# Patient Record
Sex: Male | Born: 1995 | Race: Black or African American | Marital: Single | Smoking: Never smoker
Health system: Southern US, Community
[De-identification: ages and names within clinical notes are randomized; demographics above are authoritative.]

## PROBLEM LIST (undated history)

## (undated) DIAGNOSIS — I10 Essential (primary) hypertension: Secondary | ICD-10-CM

---

## 2015-06-05 DIAGNOSIS — J189 Pneumonia, unspecified organism: Secondary | ICD-10-CM

## 2015-06-05 DIAGNOSIS — W3400XA Accidental discharge from unspecified firearms or gun, initial encounter: Secondary | ICD-10-CM

## 2015-06-05 HISTORY — PX: CHOLECYSTECTOMY: SHX55

## 2015-06-05 HISTORY — PX: COLOSTOMY: SHX63

## 2015-06-05 HISTORY — DX: Accidental discharge from unspecified firearms or gun, initial encounter: W34.00XA

## 2015-06-05 HISTORY — DX: Pneumonia, unspecified organism: J18.9

## 2020-09-09 ENCOUNTER — Other Ambulatory Visit: Payer: Self-pay | Admitting: Surgery

## 2020-09-09 DIAGNOSIS — Z933 Colostomy status: Secondary | ICD-10-CM

## 2020-09-27 ENCOUNTER — Ambulatory Visit
Admission: RE | Admit: 2020-09-27 | Discharge: 2020-09-27 | Disposition: A | Discharge status: Home / Self Care | Payer: PRIVATE HEALTH INSURANCE | Source: Ambulatory Visit | Attending: Surgery | Admitting: Surgery

## 2020-09-27 ENCOUNTER — Other Ambulatory Visit: Payer: Self-pay

## 2020-09-27 DIAGNOSIS — Z933 Colostomy status: Secondary | ICD-10-CM

## 2020-09-27 MED ORDER — IOPAMIDOL (ISOVUE-300) INJECTION 61%
100.0000 mL | Freq: Once | INTRAVENOUS | Status: AC | PRN
  Administered 2020-09-27: 100 mL via INTRAVENOUS

## 2020-11-21 NOTE — Progress Notes (Signed)
Surgical Instructions    Your procedure is scheduled on Friday June 24th.  Report to [facility] Main Entrance "A" at 1200 P.M., then check in with the Admitting office.  Call this number if you have problems the morning of surgery:  [phone]   If you have any questions prior to your surgery date call [phone]: Open Monday-Friday 8am-4pm    Remember:  Do not eat or drink anything after midnight the night before your surgery     Take these medicines the morning of surgery with A SIP OF WATER  NONE    As of today, STOP taking any Aspirin (unless otherwise instructed by your surgeon) Aleve, Naproxen, Ibuprofen, Motrin, Advil, Goody's, BC's, all herbal medications, fish oil, and all vitamins.          Do not wear jewelry  Do not wear lotions, powders, colognes, or deodorant. Do not shave 48 hours prior to surgery.  Men may shave face and neck. Do not bring valuables to the hospital. DO Not wear nail polish, gel polish, artificial nails, or any other type of covering on natural nails including finger and toenails. If patients have artificial nails, gel coating, etc. that need to be removed by a nail salon please have this removed prior to surgery or surgery may need to be canceled/delayed if the surgeon/ anesthesia feels like the patient is unable to be adequately monitored.             [facility] is not responsible for any belongings or valuables.  Do NOT Smoke (Tobacco/Vaping) or drink Alcohol 24 hours prior to your procedure If you use a CPAP at night, you may bring all equipment for your overnight stay.   Contacts, glasses, dentures or bridgework may not be worn into surgery, please bring cases for these belongings   For patients admitted to the hospital, discharge time will be determined by your treatment team.   Patients discharged the day of surgery will not be allowed to drive home, and someone needs to stay with them for 24 hours.  ONLY 1 SUPPORT PERSON MAY BE  PRESENT WHILE YOU ARE IN SURGERY. IF YOU ARE TO BE ADMITTED ONCE YOU ARE IN YOUR ROOM YOU WILL BE ALLOWED TWO (2) VISITORS.  Minor children may have two parents present. Special consideration for safety and communication needs will be reviewed on a case by case basis.  Special instructions:    Oral Hygiene is also important to reduce your risk of infection.  Remember - BRUSH YOUR TEETH THE MORNING OF SURGERY WITH YOUR REGULAR TOOTHPASTE   [facility]- Preparing For Surgery  Before surgery, you can play an important role. Because skin is not sterile, your skin needs to be as free of germs as possible. You can reduce the number of germs on your skin by washing with CHG (chlorahexidine gluconate) Soap before surgery.  CHG is an antiseptic cleaner which kills germs and bonds with the skin to continue killing germs even after washing.     Please do not use if you have an allergy to CHG or antibacterial soaps. If your skin becomes reddened/irritated stop using the CHG.  Do not shave (including legs and underarms) for at least 48 hours prior to first CHG shower. It is OK to shave your face.  Please follow these instructions carefully.     Shower the NIGHT BEFORE SURGERY and the MORNING OF SURGERY with CHG Soap.   If you chose to wash your hair, wash your hair  first as usual with your normal shampoo. After you shampoo, rinse your hair and body thoroughly to remove the shampoo.  Then [employer] and genitals (private parts) with your normal soap and rinse thoroughly to remove soap.  After that Use CHG Soap as you would any other liquid soap. You can apply CHG directly to the skin and wash gently with a scrungie or a clean washcloth.   Apply the CHG Soap to your body ONLY FROM THE NECK DOWN.  Do not use on open wounds or open sores. Avoid contact with your eyes, ears, mouth and genitals (private parts). Wash Face and genitals (private parts)  with your normal soap.   Wash thoroughly, paying special  attention to the area where your surgery will be performed.  Thoroughly rinse your body with warm water from the neck down.  DO NOT shower/wash with your normal soap after using and rinsing off the CHG Soap.  Pat yourself dry with a CLEAN TOWEL.  Wear CLEAN PAJAMAS to bed the night before surgery  Place CLEAN SHEETS on your bed the night before your surgery  DO NOT SLEEP WITH PETS.   Day of Surgery:  Take a shower with CHG soap. Wear Clean/Comfortable clothing the morning of surgery Do not apply any deodorants/lotions.   Remember to brush your teeth WITH YOUR REGULAR TOOTHPASTE.   Please read over the following fact sheets that you were given.

## 2020-11-22 ENCOUNTER — Encounter (HOSPITAL_COMMUNITY)
Admission: RE | Admit: 2020-11-22 | Discharge: 2020-11-22 | Disposition: A | Discharge status: Home / Self Care | Payer: No Typology Code available for payment source | Source: Ambulatory Visit | Attending: Surgery | Admitting: Surgery

## 2020-11-22 ENCOUNTER — Other Ambulatory Visit (HOSPITAL_COMMUNITY)
Admission: RE | Admit: 2020-11-22 | Discharge: 2020-11-22 | Disposition: A | Discharge status: Home / Self Care | Payer: No Typology Code available for payment source | Source: Ambulatory Visit | Attending: Surgery | Admitting: Surgery

## 2020-11-22 ENCOUNTER — Encounter (HOSPITAL_COMMUNITY): Payer: Self-pay

## 2020-11-22 ENCOUNTER — Ambulatory Visit: Payer: Self-pay | Admitting: Surgery

## 2020-11-22 ENCOUNTER — Other Ambulatory Visit: Payer: Self-pay

## 2020-11-22 DIAGNOSIS — Z20822 Contact with and (suspected) exposure to covid-19: Secondary | ICD-10-CM | POA: Insufficient documentation

## 2020-11-22 DIAGNOSIS — Z01818 Encounter for other preprocedural examination: Secondary | ICD-10-CM | POA: Insufficient documentation

## 2020-11-22 DIAGNOSIS — Z01812 Encounter for preprocedural laboratory examination: Secondary | ICD-10-CM | POA: Insufficient documentation

## 2020-11-22 HISTORY — DX: Essential (primary) hypertension: I10

## 2020-11-22 LAB — COMPREHENSIVE METABOLIC PANEL
ALT: 28 U/L (ref 0–44)
AST: 28 U/L (ref 15–41)
Albumin: 3.9 g/dL (ref 3.5–5.0)
Alkaline Phosphatase: 49 U/L (ref 38–126)
Anion gap: 8 (ref 5–15)
BUN: 9 mg/dL (ref 6–20)
CO2: 26 mmol/L (ref 22–32)
Calcium: 9.2 mg/dL (ref 8.9–10.3)
Chloride: 106 mmol/L (ref 98–111)
Creatinine, Ser: 1.06 mg/dL (ref 0.61–1.24)
GFR, Estimated: >60 mL/min (ref >60)
Glucose, Bld: 102 mg/dL — ABNORMAL HIGH (ref 70–99)
Potassium: 4.2 mmol/L (ref 3.5–5.1)
Sodium: 140 mmol/L (ref 135–145)
Total Bilirubin: 0.6 mg/dL (ref 0.3–1.2)
Total Protein: 7.4 g/dL (ref 6.5–8.1)

## 2020-11-22 LAB — HEMOGLOBIN A1C
Hgb A1c MFr Bld: 6.3 % — ABNORMAL HIGH (ref 4.8–5.6)
Mean Plasma Glucose: 134.11 mg/dL

## 2020-11-22 LAB — CBC
HCT: 45.6 % (ref 39.0–52.0)
Hemoglobin: 14.5 g/dL (ref 13.0–17.0)
MCH: 27.1 pg (ref 26.0–34.0)
MCHC: 31.8 g/dL (ref 30.0–36.0)
MCV: 85.1 fL (ref 80.0–100.0)
Platelets: 320 10*3/uL (ref 150–400)
RBC: 5.36 MIL/uL (ref 4.22–5.81)
RDW: 15.6 % — ABNORMAL HIGH (ref 11.5–15.5)
WBC: 10.8 10*3/uL — ABNORMAL HIGH (ref 4.0–10.5)
nRBC: 0 % (ref 0.0–0.2)

## 2020-11-22 LAB — SARS CORONAVIRUS 2 (TAT 6-24 HRS): SARS Coronavirus 2: NEGATIVE

## 2020-11-22 NOTE — Progress Notes (Signed)
Patient had received special instructions from [facility] office regarding diet and medications to take starting 5 days prior to surgery.  Using bowel prep which he has already obtained.  Switching day before surgery to clear liquid diet.
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
## 2020-11-25 ENCOUNTER — Encounter (HOSPITAL_COMMUNITY): Admission: RE | Disposition: A | Discharge status: Home / Self Care | Payer: Self-pay | Source: Home / Self Care

## 2020-11-25 ENCOUNTER — Inpatient Hospital Stay (HOSPITAL_COMMUNITY): Payer: No Typology Code available for payment source | Admitting: Physician Assistant

## 2020-11-25 ENCOUNTER — Inpatient Hospital Stay (HOSPITAL_COMMUNITY)
Admission: RE | Admit: 2020-11-25 | Discharge: 2020-12-15 | DRG: 329 | Disposition: A | Discharge status: Home Health | Payer: No Typology Code available for payment source | Attending: Surgery | Admitting: Surgery

## 2020-11-25 ENCOUNTER — Other Ambulatory Visit: Payer: Self-pay

## 2020-11-25 ENCOUNTER — Encounter (HOSPITAL_COMMUNITY): Payer: Self-pay | Admitting: Surgery

## 2020-11-25 ENCOUNTER — Inpatient Hospital Stay (HOSPITAL_COMMUNITY): Payer: No Typology Code available for payment source | Admitting: Certified Registered Nurse Anesthetist

## 2020-11-25 DIAGNOSIS — N200 Calculus of kidney: Secondary | ICD-10-CM | POA: Diagnosis present

## 2020-11-25 DIAGNOSIS — K760 Fatty (change of) liver, not elsewhere classified: Secondary | ICD-10-CM | POA: Diagnosis present

## 2020-11-25 DIAGNOSIS — Z20822 Contact with and (suspected) exposure to covid-19: Secondary | ICD-10-CM | POA: Diagnosis present

## 2020-11-25 DIAGNOSIS — Z978 Presence of other specified devices: Secondary | ICD-10-CM

## 2020-11-25 DIAGNOSIS — A419 Sepsis, unspecified organism: Secondary | ICD-10-CM | POA: Diagnosis not present

## 2020-11-25 DIAGNOSIS — K567 Ileus, unspecified: Secondary | ICD-10-CM | POA: Diagnosis not present

## 2020-11-25 DIAGNOSIS — F419 Anxiety disorder, unspecified: Secondary | ICD-10-CM | POA: Diagnosis present

## 2020-11-25 DIAGNOSIS — Z4659 Encounter for fitting and adjustment of other gastrointestinal appliance and device: Secondary | ICD-10-CM

## 2020-11-25 DIAGNOSIS — Y832 Surgical operation with anastomosis, bypass or graft as the cause of abnormal reaction of the patient, or of later complication, without mention of misadventure at the time of the procedure: Secondary | ICD-10-CM | POA: Diagnosis present

## 2020-11-25 DIAGNOSIS — W3400XA Accidental discharge from unspecified firearms or gun, initial encounter: Secondary | ICD-10-CM | POA: Diagnosis not present

## 2020-11-25 DIAGNOSIS — Z9889 Other specified postprocedural states: Secondary | ICD-10-CM | POA: Diagnosis present

## 2020-11-25 DIAGNOSIS — R188 Other ascites: Secondary | ICD-10-CM | POA: Diagnosis not present

## 2020-11-25 DIAGNOSIS — R160 Hepatomegaly, not elsewhere classified: Secondary | ICD-10-CM | POA: Diagnosis present

## 2020-11-25 DIAGNOSIS — R000 Tachycardia, unspecified: Secondary | ICD-10-CM | POA: Diagnosis not present

## 2020-11-25 DIAGNOSIS — J9690 Respiratory failure, unspecified, unspecified whether with hypoxia or hypercapnia: Secondary | ICD-10-CM

## 2020-11-25 DIAGNOSIS — K9189 Other postprocedural complications and disorders of digestive system: Secondary | ICD-10-CM | POA: Diagnosis present

## 2020-11-25 DIAGNOSIS — Z433 Encounter for attention to colostomy: Secondary | ICD-10-CM | POA: Diagnosis not present

## 2020-11-25 DIAGNOSIS — Z452 Encounter for adjustment and management of vascular access device: Secondary | ICD-10-CM

## 2020-11-25 DIAGNOSIS — I10 Essential (primary) hypertension: Secondary | ICD-10-CM | POA: Diagnosis present

## 2020-11-25 DIAGNOSIS — R0682 Tachypnea, not elsewhere classified: Secondary | ICD-10-CM | POA: Diagnosis not present

## 2020-11-25 DIAGNOSIS — N179 Acute kidney failure, unspecified: Secondary | ICD-10-CM | POA: Diagnosis not present

## 2020-11-25 DIAGNOSIS — Z9049 Acquired absence of other specified parts of digestive tract: Secondary | ICD-10-CM

## 2020-11-25 DIAGNOSIS — K435 Parastomal hernia without obstruction or  gangrene: Secondary | ICD-10-CM | POA: Diagnosis present

## 2020-11-25 DIAGNOSIS — Z79899 Other long term (current) drug therapy: Secondary | ICD-10-CM

## 2020-11-25 DIAGNOSIS — Z0189 Encounter for other specified special examinations: Secondary | ICD-10-CM

## 2020-11-25 DIAGNOSIS — L0291 Cutaneous abscess, unspecified: Secondary | ICD-10-CM

## 2020-11-25 HISTORY — PX: COLOSTOMY REVERSAL: SHX5782

## 2020-11-25 HISTORY — PX: PARASTOMAL HERNIA REPAIR: SHX2162

## 2020-11-25 HISTORY — PX: PROCTOSCOPY: SHX2266

## 2020-11-25 SURGERY — COLOSTOMY REVERSAL
Anesthesia: General | Site: Abdomen

## 2020-11-25 MED ORDER — FENTANYL CITRATE (PF) 250 MCG/5ML IJ SOLN
INTRAMUSCULAR | Status: DC | PRN
  Administered 2020-11-25 (×2): 100 ug via INTRAVENOUS
  Administered 2020-11-25 (×2): 50 ug via INTRAVENOUS
  Administered 2020-11-25: 100 ug via INTRAVENOUS
  Administered 2020-11-25 (×4): 50 ug via INTRAVENOUS
  Administered 2020-11-25: 150 ug via INTRAVENOUS
  Administered 2020-11-25: 50 ug via INTRAVENOUS

## 2020-11-25 MED ORDER — SUGAMMADEX SODIUM 200 MG/2ML IV SOLN
INTRAVENOUS | Status: DC | PRN
  Administered 2020-11-25: 300 mg via INTRAVENOUS

## 2020-11-25 MED ORDER — FENTANYL CITRATE (PF) 250 MCG/5ML IJ SOLN
INTRAMUSCULAR | Status: AC
  Filled 2020-11-25: qty 5

## 2020-11-25 MED ORDER — OXYCODONE HCL 5 MG/5ML PO SOLN: 5.0000 mg | Freq: Once | ORAL | Status: DC | PRN

## 2020-11-25 MED ORDER — HYDRALAZINE HCL 20 MG/ML IJ SOLN
INTRAMUSCULAR | Status: AC
  Filled 2020-11-25: qty 1

## 2020-11-25 MED ORDER — MEPERIDINE HCL 25 MG/ML IJ SOLN: 6.2500 mg | INTRAMUSCULAR | Status: DC | PRN

## 2020-11-25 MED ORDER — MIDAZOLAM HCL 2 MG/2ML IJ SOLN
INTRAMUSCULAR | Status: AC
  Filled 2020-11-25: qty 2

## 2020-11-25 MED ORDER — LACTATED RINGERS IV SOLN
INTRAVENOUS | Status: DC
  Administered 2020-11-27: 1000 mL via INTRAVENOUS

## 2020-11-25 MED ORDER — PROPOFOL 10 MG/ML IV BOLUS
INTRAVENOUS | Status: DC | PRN
  Administered 2020-11-25: 50 mg via INTRAVENOUS
  Administered 2020-11-25: 200 mg via INTRAVENOUS

## 2020-11-25 MED ORDER — HYDROMORPHONE HCL 1 MG/ML IJ SOLN
0.2500 mg | INTRAMUSCULAR | Status: DC | PRN
  Administered 2020-11-25 (×3): 0.5 mg via INTRAVENOUS

## 2020-11-25 MED ORDER — ACETAMINOPHEN 500 MG PO TABS
1000.0000 mg | ORAL_TABLET | Freq: Four times a day (QID) | ORAL | Status: DC
  Administered 2020-11-26 – 2020-11-30 (×13): 1000 mg via ORAL
  Filled 2020-11-25 (×15): qty 2

## 2020-11-25 MED ORDER — ACETAMINOPHEN 10 MG/ML IV SOLN
INTRAVENOUS | Status: AC
  Filled 2020-11-25: qty 100

## 2020-11-25 MED ORDER — CHLORHEXIDINE GLUCONATE CLOTH 2 % EX PADS: 6.0000 | MEDICATED_PAD | Freq: Once | CUTANEOUS | Status: DC

## 2020-11-25 MED ORDER — ACETAMINOPHEN 500 MG PO TABS
1000.0000 mg | ORAL_TABLET | ORAL | Status: AC
  Administered 2020-11-25: 1000 mg via ORAL
  Filled 2020-11-25: qty 2

## 2020-11-25 MED ORDER — PROMETHAZINE HCL 25 MG/ML IJ SOLN: 6.2500 mg | INTRAMUSCULAR | Status: DC | PRN

## 2020-11-25 MED ORDER — PHENYLEPHRINE 40 MCG/ML (10ML) SYRINGE FOR IV PUSH (FOR BLOOD PRESSURE SUPPORT)
PREFILLED_SYRINGE | INTRAVENOUS | Status: DC | PRN
  Administered 2020-11-25: 200 ug via INTRAVENOUS
  Administered 2020-11-25: 160 ug via INTRAVENOUS
  Administered 2020-11-25: 200 ug via INTRAVENOUS

## 2020-11-25 MED ORDER — PROPOFOL 10 MG/ML IV BOLUS
INTRAVENOUS | Status: AC
  Filled 2020-11-25: qty 20

## 2020-11-25 MED ORDER — HYDROMORPHONE HCL 1 MG/ML IJ SOLN
INTRAMUSCULAR | Status: AC
  Filled 2020-11-25: qty 1

## 2020-11-25 MED ORDER — AMISULPRIDE (ANTIEMETIC) 5 MG/2ML IV SOLN: 10.0000 mg | Freq: Once | INTRAVENOUS | Status: DC | PRN

## 2020-11-25 MED ORDER — MIDAZOLAM HCL 2 MG/2ML IJ SOLN
INTRAMUSCULAR | Status: DC | PRN
  Administered 2020-11-25: 2 mg via INTRAVENOUS

## 2020-11-25 MED ORDER — ENOXAPARIN SODIUM 30 MG/0.3ML IJ SOSY: 30.0000 mg | PREFILLED_SYRINGE | Freq: Two times a day (BID) | INTRAMUSCULAR | Status: DC

## 2020-11-25 MED ORDER — DEXMEDETOMIDINE (PRECEDEX) IN NS 20 MCG/5ML (4 MCG/ML) IV SYRINGE
PREFILLED_SYRINGE | INTRAVENOUS | Status: DC | PRN
Start: 2020-11-25 — End: 2020-11-25
  Administered 2020-11-25 (×2): 10 ug via INTRAVENOUS

## 2020-11-25 MED ORDER — ACETAMINOPHEN 10 MG/ML IV SOLN
1000.0000 mg | Freq: Once | INTRAVENOUS | Status: AC
  Administered 2020-11-25: 1000 mg via INTRAVENOUS

## 2020-11-25 MED ORDER — LABETALOL HCL 5 MG/ML IV SOLN
INTRAVENOUS | Status: AC
  Filled 2020-11-25: qty 4

## 2020-11-25 MED ORDER — ORAL CARE MOUTH RINSE: 15.0000 mL | Freq: Once | OROMUCOSAL | Status: AC

## 2020-11-25 MED ORDER — ALBUMIN HUMAN 5 % IV SOLN: INTRAVENOUS | Status: DC | PRN

## 2020-11-25 MED ORDER — DEXAMETHASONE SODIUM PHOSPHATE 10 MG/ML IJ SOLN
INTRAMUSCULAR | Status: AC
  Filled 2020-11-25: qty 1

## 2020-11-25 MED ORDER — ROCURONIUM BROMIDE 10 MG/ML (PF) SYRINGE
PREFILLED_SYRINGE | INTRAVENOUS | Status: AC
  Filled 2020-11-25: qty 10

## 2020-11-25 MED ORDER — HYDRALAZINE HCL 20 MG/ML IJ SOLN
10.0000 mg | Freq: Once | INTRAMUSCULAR | Status: AC
  Administered 2020-11-25: 10 mg via INTRAVENOUS

## 2020-11-25 MED ORDER — LABETALOL HCL 5 MG/ML IV SOLN
10.0000 mg | Freq: Once | INTRAVENOUS | Status: AC
  Administered 2020-11-25: 10 mg via INTRAVENOUS
  Filled 2020-11-25: qty 4

## 2020-11-25 MED ORDER — OXYCODONE HCL 5 MG PO TABS: 5.0000 mg | ORAL_TABLET | Freq: Once | ORAL | Status: DC | PRN

## 2020-11-25 MED ORDER — PHENYLEPHRINE HCL-NACL 10-0.9 MG/250ML-% IV SOLN
INTRAVENOUS | Status: DC | PRN
  Administered 2020-11-25: 50 ug/min via INTRAVENOUS

## 2020-11-25 MED ORDER — DEXAMETHASONE SODIUM PHOSPHATE 10 MG/ML IJ SOLN
INTRAMUSCULAR | Status: DC | PRN
  Administered 2020-11-25: 10 mg via INTRAVENOUS

## 2020-11-25 MED ORDER — ONDANSETRON 4 MG PO TBDP: 4.0000 mg | ORAL_TABLET | Freq: Four times a day (QID) | ORAL | Status: DC | PRN

## 2020-11-25 MED ORDER — ONDANSETRON HCL 4 MG/2ML IJ SOLN
INTRAMUSCULAR | Status: AC
  Filled 2020-11-25: qty 2

## 2020-11-25 MED ORDER — PROPOFOL 10 MG/ML IV BOLUS
INTRAVENOUS | Status: AC
  Filled 2020-11-25: qty 40

## 2020-11-25 MED ORDER — HYDROMORPHONE HCL 1 MG/ML IJ SOLN
INTRAMUSCULAR | Status: DC | PRN
  Administered 2020-11-25: .5 mg via INTRAVENOUS

## 2020-11-25 MED ORDER — OXYCODONE HCL 5 MG/5ML PO SOLN
5.0000 mg | ORAL | Status: DC | PRN
  Administered 2020-11-26 – 2020-11-29 (×15): 10 mg via ORAL
  Filled 2020-11-25 (×16): qty 10

## 2020-11-25 MED ORDER — ROCURONIUM BROMIDE 10 MG/ML (PF) SYRINGE
PREFILLED_SYRINGE | INTRAVENOUS | Status: DC | PRN
  Administered 2020-11-25 (×3): 100 mg via INTRAVENOUS

## 2020-11-25 MED ORDER — HYDROMORPHONE HCL 1 MG/ML IJ SOLN
INTRAMUSCULAR | Status: AC
  Filled 2020-11-25: qty 0.5

## 2020-11-25 MED ORDER — 0.9 % SODIUM CHLORIDE (POUR BTL) OPTIME
TOPICAL | Status: DC | PRN
  Administered 2020-11-25 (×2): 2000 mL
  Administered 2020-11-25: 1000 mL

## 2020-11-25 MED ORDER — LABETALOL HCL 5 MG/ML IV SOLN
10.0000 mg | Freq: Once | INTRAVENOUS | Status: AC
  Administered 2020-11-25: 10 mg via INTRAVENOUS

## 2020-11-25 MED ORDER — LIDOCAINE 2% (20 MG/ML) 5 ML SYRINGE
INTRAMUSCULAR | Status: DC | PRN
  Administered 2020-11-25: 100 mg via INTRAVENOUS

## 2020-11-25 MED ORDER — MORPHINE SULFATE (PF) 2 MG/ML IV SOLN
2.0000 mg | INTRAVENOUS | Status: DC | PRN
Start: 2020-11-25 — End: 2020-11-29
  Administered 2020-11-26 – 2020-11-28 (×8): 4 mg via INTRAVENOUS
  Filled 2020-11-25 (×8): qty 2

## 2020-11-25 MED ORDER — ONDANSETRON HCL 4 MG/2ML IJ SOLN: 4.0000 mg | Freq: Four times a day (QID) | INTRAMUSCULAR | Status: DC | PRN

## 2020-11-25 MED ORDER — SUGAMMADEX SODIUM 500 MG/5ML IV SOLN
INTRAVENOUS | Status: AC
  Filled 2020-11-25: qty 5

## 2020-11-25 MED ORDER — ONDANSETRON HCL 4 MG/2ML IJ SOLN
INTRAMUSCULAR | Status: DC | PRN
  Administered 2020-11-25: 4 mg via INTRAVENOUS

## 2020-11-25 MED ORDER — KETOROLAC TROMETHAMINE 15 MG/ML IJ SOLN
30.0000 mg | Freq: Four times a day (QID) | INTRAMUSCULAR | Status: DC
  Administered 2020-11-25 – 2020-11-30 (×18): 30 mg via INTRAVENOUS
  Filled 2020-11-25 (×18): qty 2

## 2020-11-25 MED ORDER — METHOCARBAMOL 1000 MG/10ML IJ SOLN
1000.0000 mg | Freq: Three times a day (TID) | INTRAVENOUS | Status: DC
  Administered 2020-11-25 – 2020-11-28 (×8): 1000 mg via INTRAVENOUS
  Filled 2020-11-25 (×2): qty 10
  Filled 2020-11-25: qty 1000
  Filled 2020-11-25: qty 10
  Filled 2020-11-25: qty 1000
  Filled 2020-11-25 (×6): qty 10

## 2020-11-25 MED ORDER — CEFAZOLIN IN SODIUM CHLORIDE 3-0.9 GM/100ML-% IV SOLN
3.0000 g | INTRAVENOUS | Status: AC
  Administered 2020-11-25: 3 g via INTRAVENOUS
  Filled 2020-11-25: qty 100

## 2020-11-25 MED ORDER — CHLORHEXIDINE GLUCONATE 0.12 % MT SOLN
15.0000 mL | Freq: Once | OROMUCOSAL | Status: AC
  Administered 2020-11-25: 15 mL via OROMUCOSAL
  Filled 2020-11-25: qty 15

## 2020-11-25 MED ORDER — DOCUSATE SODIUM 100 MG PO CAPS
100.0000 mg | ORAL_CAPSULE | Freq: Two times a day (BID) | ORAL | Status: DC
  Administered 2020-11-25 – 2020-11-29 (×8): 100 mg via ORAL
  Filled 2020-11-25 (×8): qty 1

## 2020-11-25 MED ORDER — PHENYLEPHRINE 40 MCG/ML (10ML) SYRINGE FOR IV PUSH (FOR BLOOD PRESSURE SUPPORT)
PREFILLED_SYRINGE | INTRAVENOUS | Status: AC
  Filled 2020-11-25: qty 10

## 2020-11-25 MED ORDER — LACTATED RINGERS IV SOLN: INTRAVENOUS | Status: DC

## 2020-11-25 SURGICAL SUPPLY — 63 items
BINDER ABDOMINAL 12 ML 46-62 (SOFTGOODS) ×3 IMPLANT
BLADE CLIPPER SURG (BLADE) IMPLANT
CANISTER SUCT 3000ML PPV (MISCELLANEOUS) ×3 IMPLANT
CHLORAPREP W/TINT 26 (MISCELLANEOUS) IMPLANT
COVER SURGICAL LIGHT HANDLE (MISCELLANEOUS) ×6 IMPLANT
COVER WAND RF STERILE (DRAPES) ×3 IMPLANT
DRAPE LAPAROSCOPIC ABDOMINAL (DRAPES) ×3 IMPLANT
DRSG OPSITE POSTOP 4X10 (GAUZE/BANDAGES/DRESSINGS) ×3 IMPLANT
DRSG OPSITE POSTOP 4X8 (GAUZE/BANDAGES/DRESSINGS) IMPLANT
DRSG PAD ABDOMINAL 8X10 ST (GAUZE/BANDAGES/DRESSINGS) ×9 IMPLANT
ELECT CAUTERY BLADE 6.4 (BLADE) ×3 IMPLANT
ELECT REM PT RETURN 9FT ADLT (ELECTROSURGICAL) ×3
ELECTRODE REM PT RTRN 9FT ADLT (ELECTROSURGICAL) ×2 IMPLANT
GLOVE SRG 8 PF TXTR STRL LF DI (GLOVE) ×4 IMPLANT
GLOVE SURG ENC MOIS LTX SZ6.5 (GLOVE) ×3 IMPLANT
GLOVE SURG ENC MOIS LTX SZ8 (GLOVE) ×6 IMPLANT
GLOVE SURG UNDER POLY LF SZ6 (GLOVE) ×3 IMPLANT
GLOVE SURG UNDER POLY LF SZ8 (GLOVE) ×2
GOWN STRL REUS W/ TWL LRG LVL3 (GOWN DISPOSABLE) ×8 IMPLANT
GOWN STRL REUS W/ TWL XL LVL3 (GOWN DISPOSABLE) ×4 IMPLANT
GOWN STRL REUS W/TWL LRG LVL3 (GOWN DISPOSABLE) ×4
GOWN STRL REUS W/TWL XL LVL3 (GOWN DISPOSABLE) ×2
KIT BASIN OR (CUSTOM PROCEDURE TRAY) ×3 IMPLANT
KIT SIGMOIDOSCOPE (SET/KITS/TRAYS/PACK) ×3 IMPLANT
KIT TURNOVER KIT B (KITS) ×3 IMPLANT
LIGASURE IMPACT 36 18CM CVD LR (INSTRUMENTS) IMPLANT
MARKER SKIN DUAL TIP RULER LAB (MISCELLANEOUS) ×3 IMPLANT
NEEDLE HYPO 25GX1X1/2 BEV (NEEDLE) ×3 IMPLANT
NS IRRIG 1000ML POUR BTL (IV SOLUTION) ×6 IMPLANT
PACK COLON (CUSTOM PROCEDURE TRAY) ×3 IMPLANT
PACK GENERAL/GYN (CUSTOM PROCEDURE TRAY) ×3 IMPLANT
PAD ARMBOARD 7.5X6 YLW CONV (MISCELLANEOUS) ×6 IMPLANT
PENCIL SMOKE EVACUATOR (MISCELLANEOUS) ×3 IMPLANT
SPECIMEN JAR MEDIUM (MISCELLANEOUS) IMPLANT
SPONGE LAP 18X18 RF (DISPOSABLE) ×12 IMPLANT
STAPLER ECHELON POWER CIR 29 (STAPLE) ×3 IMPLANT
STAPLER VISISTAT 35W (STAPLE) ×3 IMPLANT
SURGILUBE 2OZ TUBE FLIPTOP (MISCELLANEOUS) IMPLANT
SUT MNCRL AB 4-0 PS2 18 (SUTURE) ×3 IMPLANT
SUT NOVA 1 T20/GS 25DT (SUTURE) IMPLANT
SUT NOVA NAB DX-16 0-1 5-0 T12 (SUTURE) ×6 IMPLANT
SUT PDS AB 1 CTX 36 (SUTURE) ×6 IMPLANT
SUT PDS AB 1 TP1 54 (SUTURE) IMPLANT
SUT PDS AB 1 TP1 96 (SUTURE) IMPLANT
SUT PDS AB 3-0 SH 27 (SUTURE) ×6 IMPLANT
SUT PROLENE 2 0 CT2 30 (SUTURE) IMPLANT
SUT PROLENE 2 0 KS (SUTURE) IMPLANT
SUT SILK 2 0 SH CR/8 (SUTURE) ×9 IMPLANT
SUT SILK 2 0 TIES 10X30 (SUTURE) ×3 IMPLANT
SUT SILK 2 0SH CR/8 30 (SUTURE) ×3 IMPLANT
SUT SILK 3 0 SH CR/8 (SUTURE) ×3 IMPLANT
SUT SILK 3 0 TIES 10X30 (SUTURE) ×3 IMPLANT
SUT VIC AB 0 CT1 27 (SUTURE) ×1
SUT VIC AB 0 CT1 27XBRD ANBCTR (SUTURE) ×2 IMPLANT
SUT VIC AB 3-0 SH 27 (SUTURE) ×1
SUT VIC AB 3-0 SH 27XBRD (SUTURE) ×2 IMPLANT
SYR CONTROL 10ML LL (SYRINGE) ×3 IMPLANT
TOWEL GREEN STERILE (TOWEL DISPOSABLE) ×3 IMPLANT
TOWEL GREEN STERILE FF (TOWEL DISPOSABLE) ×3 IMPLANT
TRAY FOLEY MTR SLVR 14FR STAT (SET/KITS/TRAYS/PACK) ×3 IMPLANT
TUBE CONNECTING 12X1/4 (SUCTIONS) ×6 IMPLANT
UNDERPAD 30X36 HEAVY ABSORB (UNDERPADS AND DIAPERS) IMPLANT
WATER STERILE IRR 1000ML POUR (IV SOLUTION) ×3 IMPLANT

## 2020-11-25 NOTE — Anesthesia Procedure Notes (Signed)
Procedure Name: Intubation Date/Time: 11/25/2020 1:30 PM Performed by: Rosiland Oz, CRNA Pre-anesthesia Checklist: Patient identified, Emergency Drugs available, Suction available, Patient being monitored and Timeout performed Patient Re-evaluated:Patient Re-evaluated prior to induction Oxygen Delivery Method: Circle system utilized Preoxygenation: Pre-oxygenation with 100% oxygen Induction Type: IV induction Ventilation: Mask ventilation without difficulty and Oral airway inserted - appropriate to patient size Laryngoscope Size: Miller and 3 Grade View: Grade I Tube type: Oral Tube size: 7.5 mm Number of attempts: 1 Airway Equipment and Method: Stylet Placement Confirmation: ETT inserted through vocal cords under direct vision, positive ETCO2 and breath sounds checked- equal and bilateral Secured at: 22 cm Tube secured with: Tape Dental Injury: Teeth and Oropharynx as per pre-operative assessment
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
## 2020-11-25 NOTE — Transfer of Care (Signed)
Immediate Anesthesia Transfer of Care Note  Patient: [facility]  Procedure(s) Performed: COLOSTOMY REVERSAL (Abdomen) HERNIA REPAIR PARASTOMAL (Abdomen) PROCTOSCOPY  Patient Location: PACU  Anesthesia Type:General  Level of Consciousness: drowsy and patient cooperative  Airway & Oxygen Therapy: Patient Spontanous Breathing and Patient connected to nasal cannula oxygen  Post-op Assessment: Report given to RN, Post -op Vital signs reviewed and stable and Patient moving all extremities  Post vital signs: Reviewed and stable  Last Vitals:  Vitals Value Taken Time  BP 170/108 11/25/20 1825  Temp    Pulse 123 11/25/20 1827  Resp 16 11/25/20 1827  SpO2 97 % 11/25/20 1827  Vitals shown include unvalidated device data.  Last Pain:  Vitals:   11/25/20 1219  TempSrc:   PainSc: 0-No pain         Complications: No notable events documented.
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
## 2020-11-25 NOTE — Anesthesia Postprocedure Evaluation (Signed)
Anesthesia Post Note  Patient: Interior and spatial designer  Procedure(s) Performed: COLOSTOMY REVERSAL (Abdomen) HERNIA REPAIR PARASTOMAL (Abdomen) PROCTOSCOPY     Patient location during evaluation: PACU Anesthesia Type: General Level of consciousness: awake and alert Pain management: pain level controlled Vital Signs Assessment: post-procedure vital signs reviewed and stable Respiratory status: spontaneous breathing, nonlabored ventilation, respiratory function stable and patient connected to nasal cannula oxygen Cardiovascular status: blood pressure returned to baseline and stable Postop Assessment: no apparent nausea or vomiting Anesthetic complications: no   No notable events documented.  Last Vitals:  Vitals:   11/25/20 1925 11/25/20 2200  BP: (!) 153/86   Pulse: 100   Resp: 20 20  Temp:    SpO2: 96%     Last Pain:  Vitals:   11/25/20 1925  TempSrc:   PainSc: Asleep                 Cecile Hearing

## 2020-11-25 NOTE — Progress Notes (Signed)
Received pt from PACU, alert and oriented. Neuro check performed. Patient requesting pain medicine. Abdominal binder applied per MD's order. Will continue monitoring.

## 2020-11-25 NOTE — Progress Notes (Signed)
Notified dr. Bedelia Person and dr Hyacinth Meeker of elevated blood pressure and repeat blood pressure after labetalol.

## 2020-11-25 NOTE — Progress Notes (Signed)
SBP=181/112, MD notified. Hydralazine 10 mg,x1 ordered, SBP rechecked before the administration of hydralazine,-156.116. hydralazine administered, SBP rechecked, and SBP=173/116. Will recheck in 10 minutes.

## 2020-11-26 ENCOUNTER — Encounter (HOSPITAL_COMMUNITY): Payer: Self-pay | Admitting: Surgery

## 2020-11-26 LAB — CBC
HCT: 38.6 % — ABNORMAL LOW (ref 39.0–52.0)
Hemoglobin: 12.7 g/dL — ABNORMAL LOW (ref 13.0–17.0)
MCH: 27.4 pg (ref 26.0–34.0)
MCHC: 32.9 g/dL (ref 30.0–36.0)
MCV: 83.4 fL (ref 80.0–100.0)
Platelets: 313 10*3/uL (ref 150–400)
RBC: 4.63 MIL/uL (ref 4.22–5.81)
RDW: 15.9 % — ABNORMAL HIGH (ref 11.5–15.5)
WBC: 18.8 10*3/uL — ABNORMAL HIGH (ref 4.0–10.5)
nRBC: 0 % (ref 0.0–0.2)

## 2020-11-26 LAB — BASIC METABOLIC PANEL
Anion gap: 11 (ref 5–15)
BUN: 14 mg/dL (ref 6–20)
CO2: 23 mmol/L (ref 22–32)
Calcium: 8.7 mg/dL — ABNORMAL LOW (ref 8.9–10.3)
Chloride: 101 mmol/L (ref 98–111)
Creatinine, Ser: 1.13 mg/dL (ref 0.61–1.24)
GFR, Estimated: >60 mL/min (ref >60)
Glucose, Bld: 145 mg/dL — ABNORMAL HIGH (ref 70–99)
Potassium: 3.8 mmol/L (ref 3.5–5.1)
Sodium: 135 mmol/L (ref 135–145)

## 2020-11-26 LAB — MAGNESIUM: Magnesium: 1.6 mg/dL — ABNORMAL LOW (ref 1.7–2.4)

## 2020-11-26 LAB — HIV ANTIBODY (ROUTINE TESTING W REFLEX): HIV Screen 4th Generation wRfx: NONREACTIVE

## 2020-11-26 LAB — PHOSPHORUS: Phosphorus: 2.5 mg/dL (ref 2.5–4.6)

## 2020-11-26 MED ORDER — ENOXAPARIN SODIUM 40 MG/0.4ML IJ SOSY
40.0000 mg | PREFILLED_SYRINGE | INTRAMUSCULAR | Status: DC
  Filled 2020-11-26: qty 0.4

## 2020-11-26 MED ORDER — POTASSIUM PHOSPHATES 15 MMOLE/5ML IV SOLN
20.0000 mmol | Freq: Once | INTRAVENOUS | Status: AC
  Administered 2020-11-26: 20 mmol via INTRAVENOUS
  Filled 2020-11-26: qty 6.67

## 2020-11-26 MED ORDER — MAGNESIUM SULFATE 2 GM/50ML IV SOLN
2.0000 g | Freq: Once | INTRAVENOUS | Status: AC
  Administered 2020-11-26: 2 g via INTRAVENOUS
  Filled 2020-11-26: qty 50

## 2020-11-26 NOTE — Evaluation (Signed)
Occupational Therapy Evaluation Patient Details Name: Jesse Waters MRN: [id number] DOB: [birth year] Today's Date: 11/26/2020    History of Present Illness This 25 y.o. male admitted 11/22/20 for colostomy reversal and hernia repair.  PMH includes:  HTN, PNA   Clinical Impression   Pt admitted with above. He demonstrates the below listed deficits and will benefit from continued OT to maximize safety and independence with BADLs.  Pt presents to OT with generalized weakness, decreased activity tolerance, increased pain.  He currently requires mod - total A for ADLs and  mod A +2 for bed mobility due to pain.  Unable to progress OOB today due to orthostatic hypotension.   Pt reports he lives at home with family and has good supports.  He reports he was fully independent PTA.  Anticipate good progress as pain is controlled.    BP initially sitting EOB 146/72;  BP EOB  after sitting several minutes 107/42 with pt becoming increasingly sleepy and less responsive,supine;  BP 155/66 after return to supine   Follow Up Recommendations  No OT follow up;Supervision/Assistance - 24 hour (initially)    Equipment Recommendations  3 in 1 bedside commode;Tub/shower seat    Recommendations for Other Services       Precautions / Restrictions Precautions Precautions: Fall Precaution Comments: abdominal binder; orthostatic hypotension Restrictions Weight Bearing Restrictions: No      Mobility Bed Mobility Overal bed mobility: Needs Assistance Bed Mobility: Rolling;Sidelying to Sit;Sit to Supine Rolling: Mod assist;+2 for physical assistance Sidelying to sit: Mod assist;+2 for physical assistance;HOB elevated   Sit to supine: Max assist;+2 for safety/equipment   General bed mobility comments: Pt needing extensive period of time due to high anxiety and pain (> 30 minutes) to log roll and sit up EOB. Pt needing several rest breaks mid-roll to mentally prepare to complete it. Cues provided to push through  legs and pull on therapist hand to roll. Cues to bring legs off EOB and pull up with R hand on therapist hand and push up from bed with L UE to ascend trunk. ModAx2 to complete. MaxAx2 to return to supine as pt displayed symptomatic orthostatic hypotension.    Transfers                 General transfer comment: Deferred due to symptomatic orthostatic hypotension.    Balance Overall balance assessment: Needs assistance Sitting-balance support: No upper extremity supported;Single extremity supported;Feet supported Sitting balance-Leahy Scale: Fair Sitting balance - Comments: Able to sit statically without UE support but prefers to prop himself up on his R elbow on the bed rail at end of bed.       Standing balance comment: Deferred due to symptomatic orthostatic hypotension.                           ADL either performed or assessed with clinical judgement   ADL Overall ADL's : Needs assistance/impaired     Grooming: Wash/dry hands;Wash/dry face;Oral care;Brushing hair;Set up;Bed level   Upper Body Bathing: Moderate assistance;Bed level   Lower Body Bathing: Maximal assistance;Bed level   Upper Body Dressing : Moderate assistance;Bed level;Sitting   Lower Body Dressing: Total assistance;Bed level   Toilet Transfer: Total assistance Toilet Transfer Details (indicate cue type and reason): unable Toileting- Clothing Manipulation and Hygiene: Total assistance;Bed level       Functional mobility during ADLs: Moderate assistance;+2 for physical assistance;+2 for safety/equipment (bed mobility only)  Vision         Perception     Praxis      Pertinent Vitals/Pain Pain Assessment: 0-10 Pain Score: 10-Worst pain ever Pain Location: abdomen Pain Descriptors / Indicators: Discomfort;Guarding;Grimacing;Moaning Pain Intervention(s): Monitored during session;Limited activity within patient's tolerance;Repositioned     Hand Dominance Right    Extremity/Trunk Assessment Upper Extremity Assessment Upper Extremity Assessment: Generalized weakness   Lower Extremity Assessment Lower Extremity Assessment: Defer to PT evaluation   Cervical / Trunk Assessment Cervical / Trunk Assessment: Normal   Communication Communication Communication: No difficulties   Cognition Arousal/Alertness: Awake/alert Behavior During Therapy: Flat affect;Anxious Overall Cognitive Status: Within Functional Limits for tasks assessed                                 General Comments: Pt with high anxiety with movement secondary to pain, needing continual encouragement throughout session. Pt with fairly flat affect and taking increased time to respond to questions, especially as his BP dropped.   General Comments  BP initially sitting EOB 146/72; BP after sitting EOB several minutes 107/42 with pt becoming increasingly sleepy and less responsive, thus pt returned to supine; BP once supine 155/66 with pt becoming more awake and responsive; RN notified    Exercises     Shoulder Instructions      Home Living Family/patient expects to be discharged to:: Private residence Living Arrangements: Parent Available Help at Discharge: Family Type of Home: House Home Access: Ramped entrance     Home Layout: One level     Bathroom Shower/Tub: Tub/shower unit;Walk-in shower (uses walk-in shower)   Bathroom Toilet: Standard     Home Equipment: Environmental consultant - 2 wheels;Cane - single point          Prior Functioning/Environment Level of Independence: Independent                 OT Problem List: Decreased strength;Decreased activity tolerance;Impaired balance (sitting and/or standing);Decreased safety awareness;Decreased knowledge of use of DME or AE;Obesity;Cardiopulmonary status limiting activity;Pain      OT Treatment/Interventions: Self-care/ADL training;Therapeutic exercise;DME and/or AE instruction;Therapeutic  activities;Patient/family education;Balance training    OT Goals(Current goals can be found in the care plan section) Acute Rehab OT Goals Patient Stated Goal: to not hurt OT Goal Formulation: With patient Time For Goal Achievement: 12/09/20 Potential to Achieve Goals: Good ADL Goals Pt Will Perform Grooming: with min guard assist;standing Pt Will Perform Upper Body Bathing: with set-up;sitting Pt Will Perform Lower Body Bathing: with min guard assist;sit to/from stand;with adaptive equipment Pt Will Perform Upper Body Dressing: with set-up;with supervision;sitting Pt Will Perform Lower Body Dressing: sit to/from stand;with adaptive equipment;with min guard assist Pt Will Transfer to Toilet: with min guard assist;ambulating;regular height toilet;bedside commode;grab bars Pt Will Perform Toileting - Clothing Manipulation and hygiene: with min guard assist;sit to/from stand Pt Will Perform Tub/Shower Transfer: Shower transfer;ambulating;shower seat;3 in 1;rolling walker  OT Frequency: Min 2X/week   Barriers to D/C:            Co-evaluation PT/OT/SLP Co-Evaluation/Treatment: Yes Reason for Co-Treatment: For patient/therapist safety;To address functional/ADL transfers PT goals addressed during session: Mobility/safety with mobility;Balance OT goals addressed during session: Strengthening/ROM;ADL's and self-care      AM-PAC OT "6 Clicks" Daily Activity     Outcome Measure Help from another person eating meals?: None Help from another person taking care of personal grooming?: A Little Help from another person toileting, which includes  using toliet, bedpan, or urinal?: Total Help from another person bathing (including washing, rinsing, drying)?: A Lot Help from another person to put on and taking off regular upper body clothing?: A Lot Help from another person to put on and taking off regular lower body clothing?: Total 6 Click Score: 13   End of Session Equipment Utilized During  Treatment: Oxygen Nurse Communication: Mobility status;Other (comment) (orthostasis)  Activity Tolerance: Treatment limited secondary to medical complications (Comment) (orthostatic hypotension) Patient left: in bed;with call bell/phone within reach;with bed alarm set  OT Visit Diagnosis: Unsteadiness on feet (R26.81);Pain Pain - part of body:  (abdomen)                Time: 5397-6734 OT Time Calculation (min): 60 min Charges:  OT General Charges $OT Visit: 1 Visit OT Evaluation $OT Eval Moderate Complexity: 1 Mod OT Treatments $Therapeutic Activity: 8-22 mins  Eber Jones., OTR/L Acute Rehabilitation Services Pager [phone] Office [phone]   Jeani Hawking M 11/26/2020, 1:55 PM

## 2020-11-26 NOTE — Progress Notes (Signed)
Trauma/Critical Care Follow Up Note  Subjective:    Overnight Issues:   Objective:  Vital signs for last 24 hours: Temp:  [97 F (36.1 C)-99.2 F (37.3 C)] 99.2 F (37.3 C) (06/25 0322) Pulse Rate:  [96-120] 111 (06/25 0322) Resp:  [16-40] 20 (06/25 0322) BP: (132-189)/(77-116) 132/77 (06/25 0322) SpO2:  [95 %-98 %] 95 % (06/25 0322) Weight:  [153.2 kg] 153.2 kg (06/24 1200)  Hemodynamic parameters for last 24 hours:    Intake/Output from previous day: 06/24 0701 - 06/25 0700 In: 4723.8 [I.V.:4123.8; IV Piggyback:600] Out: 750 [Urine:600; Blood:150]  Intake/Output this shift: No intake/output data recorded.  Vent settings for last 24 hours:    Physical Exam:  Gen: comfortable, no distress Neuro: non-focal exam HEENT: PERRL Neck: supple CV: RRR Pulm: unlabored breathing Abd: soft, appropriately TTP, wound dressed with minimal staining GU: clear yellow urine, foley Extr: wwp, no edema   Results for orders placed or performed during the hospital encounter of 11/25/20 (from the past 24 hour(s))  CBC     Status: Abnormal   Collection Time: 11/26/20  2:05 AM  Result Value Ref Range   WBC 18.8 (H) 4.0 - 10.5 K/uL   RBC 4.63 4.22 - 5.81 MIL/uL   Hemoglobin 12.7 (L) 13.0 - 17.0 g/dL   HCT 16.1 (L) 09.6 - 04.5 %   MCV 83.4 80.0 - 100.0 fL   MCH 27.4 26.0 - 34.0 pg   MCHC 32.9 30.0 - 36.0 g/dL   RDW 40.9 (H) 81.1 - 91.4 %   Platelets 313 150 - 400 K/uL   nRBC 0.0 0.0 - 0.2 %  Basic metabolic panel     Status: Abnormal   Collection Time: 11/26/20  2:05 AM  Result Value Ref Range   Sodium 135 135 - 145 mmol/L   Potassium 3.8 3.5 - 5.1 mmol/L   Chloride 101 98 - 111 mmol/L   CO2 23 22 - 32 mmol/L   Glucose, Bld 145 (H) 70 - 99 mg/dL   BUN 14 6 - 20 mg/dL   Creatinine, Ser 7.82 0.61 - 1.24 mg/dL   Calcium 8.7 (L) 8.9 - 10.3 mg/dL   GFR, Estimated >95 >62 mL/min   Anion gap 11 5 - 15  Magnesium     Status: Abnormal   Collection Time: 11/26/20  2:05 AM   Result Value Ref Range   Magnesium 1.6 (L) 1.7 - 2.4 mg/dL  Phosphorus     Status: None   Collection Time: 11/26/20  2:05 AM  Result Value Ref Range   Phosphorus 2.5 2.5 - 4.6 mg/dL    Assessment & Plan: The plan of care was discussed with the bedside nurse for the day, Marena Chancy, who is in agreement with this plan and no additional concerns were raised.   Present on Admission:  History of colostomy reversal    LOS: 1 day   Additional comments:I reviewed the patient's new clinical lab test results.   and I reviewed the patients new imaging test results.    Remote h/o polytrauma with colostomy and parastomal hernia  - s/p exlap, colostomy reversal, and parastomal hernia repair. Poor pain control, meds not being maximized. Discussed with nursing. Foley - d/c FEN - CLD, replete mag, phos DVT - SCDs, LMWH to start in AM Dispo - 4NP    Diamantina Monks, MD Trauma & General Surgery Please use [email or web address] to contact on call provider  11/26/2020  *Care during the described time interval was provided by  me. I have reviewed this patient's available data, including medical history, events of note, physical examination and test results as part of my evaluation.

## 2020-11-26 NOTE — Plan of Care (Signed)

## 2020-11-26 NOTE — Evaluation (Signed)
Physical Therapy Evaluation Patient Details Name: Jesse Waters MRN: [id number] DOB: [birth year] Today's Date: 11/26/2020   History of Present Illness  This 25 y.o. male admitted 11/22/20 for colostomy reversal and hernia repair.  PMH includes:  HTN, PNA  Clinical Impression  Pt presents with condition above and deficits mentioned below, see PT Problem List. PTA, he was living with his father in a 1-level house with a ramped entrance and was independent. Currently, pt with anxiety, pain, and symptomatic orthostatic hypotension limiting his tolerance and ability to mobilize. Pt needed extensive period of time to log roll and sit up EOB with modAx2. Once sitting EOB his BP dropped and he began to get lethargic, thus returned to supine in bed with maxAx2. Expect pt to progress well to his baseline once his BP is controlled and his pain decreases. Will continue to follow acutely.    Follow Up Recommendations No PT follow up;Supervision for mobility/OOB    Equipment Recommendations  3in1 (PT)    Recommendations for Other Services       Precautions / Restrictions Precautions Precautions: Fall Precaution Comments: abdominal binder; orthostatic hypotension Restrictions Weight Bearing Restrictions: No      Mobility  Bed Mobility Overal bed mobility: Needs Assistance Bed Mobility: Rolling;Sidelying to Sit;Sit to Supine Rolling: Mod assist;+2 for physical assistance Sidelying to sit: Mod assist;+2 for physical assistance;HOB elevated   Sit to supine: Max assist;+2 for safety/equipment   General bed mobility comments: Pt needing extensive period of time due to high anxiety and pain (> 30 minutes) to log roll and sit up EOB. Pt needing several rest breaks mid-roll to mentally prepare to complete it. Cues provided to push through legs and pull on therapist hand to roll. Cues to bring legs off EOB and pull up with R hand on therapist hand and push up from bed with L UE to ascend trunk. ModAx2 to  complete. MaxAx2 to return to supine as pt displayed symptomatic orthostatic hypotension.    Transfers                 General transfer comment: Deferred due to symptomatic orthostatic hypotension.  Ambulation/Gait             General Gait Details: Deferred due to symptomatic orthostatic hypotension.  Stairs            Wheelchair Mobility    Modified Rankin (Stroke Patients Only)       Balance Overall balance assessment: Needs assistance Sitting-balance support: No upper extremity supported;Single extremity supported;Feet supported Sitting balance-Leahy Scale: Fair Sitting balance - Comments: Able to sit statically without UE support but prefers to prop himself up on his R elbow on the bed rail at end of bed.       Standing balance comment: Deferred due to symptomatic orthostatic hypotension.                             Pertinent Vitals/Pain Pain Assessment: 0-10 Pain Score: 10-Worst pain ever Pain Location: abdomen Pain Descriptors / Indicators: Discomfort;Guarding;Grimacing;Moaning Pain Intervention(s): Limited activity within patient's tolerance;Monitored during session;Repositioned;RN gave pain meds during session    Home Living Family/patient expects to be discharged to:: Private residence Living Arrangements: Parent Available Help at Discharge: Family Type of Home: House Home Access: Ramped entrance     Home Layout: One level Home Equipment: Environmental consultant - 2 wheels;Cane - single point      Prior Function Level of Independence: Independent  Hand Dominance        Extremity/Trunk Assessment   Upper Extremity Assessment Upper Extremity Assessment: Defer to OT evaluation    Lower Extremity Assessment Lower Extremity Assessment: Generalized weakness    Cervical / Trunk Assessment Cervical / Trunk Assessment: Normal  Communication   Communication: No difficulties  Cognition Arousal/Alertness:  Awake/alert Behavior During Therapy: Flat affect;Anxious Overall Cognitive Status: Within Functional Limits for tasks assessed                                 General Comments: Pt with high anxiety with movement secondary to pain, needing continual encouragement throughout session. Pt with fairly flat affect and taking increased time to respond to questions, especially as his BP dropped.      General Comments General comments (skin integrity, edema, etc.): BP initially sitting EOB 146/72; BP after sitting EOB several minutes 107/42 with pt becoming increasingly sleepy and less responsive, thus pt returned to supine; BP once supine 155/66 with pt becoming more awake and responsive; RN notified    Exercises     Assessment/Plan    PT Assessment Patient needs continued PT services  PT Problem List Decreased strength;Decreased activity tolerance;Decreased balance;Decreased mobility;Obesity;Pain       PT Treatment Interventions DME instruction;Gait training;Functional mobility training;Therapeutic activities;Therapeutic exercise;Balance training;Neuromuscular re-education;Patient/family education    PT Goals (Current goals can be found in the Care Plan section)  Acute Rehab PT Goals Patient Stated Goal: to not hurt PT Goal Formulation: With patient Time For Goal Achievement: 12/10/20 Potential to Achieve Goals: Good    Frequency Min 3X/week   Barriers to discharge        Co-evaluation PT/OT/SLP Co-Evaluation/Treatment: Yes Reason for Co-Treatment: For patient/therapist safety;To address functional/ADL transfers PT goals addressed during session: Mobility/safety with mobility;Balance         AM-PAC PT "6 Clicks" Mobility  Outcome Measure Help needed turning from your back to your side while in a flat bed without using bedrails?: Total Help needed moving from lying on your back to sitting on the side of a flat bed without using bedrails?: Total Help needed moving  to and from a bed to a chair (including a wheelchair)?: Total Help needed standing up from a chair using your arms (e.g., wheelchair or bedside chair)?: Total Help needed to walk in hospital room?: Total Help needed climbing 3-5 steps with a railing? : Total 6 Click Score: 6    End of Session Equipment Utilized During Treatment: Oxygen Activity Tolerance: Patient limited by pain;Other (comment) (limited by symptomatic orthostatic hypotension) Patient left: in bed;with call bell/phone within reach;with bed alarm set;with SCD's reapplied Nurse Communication: Mobility status;Other (comment) (BP) PT Visit Diagnosis: Muscle weakness (generalized) (M62.81);Difficulty in walking, not elsewhere classified (R26.2);Pain Pain - part of body:  (abdomen)    Time: 1062-6948 PT Time Calculation (min) (ACUTE ONLY): 57 min   Charges:   PT Evaluation $PT Eval Low Complexity: 1 Low PT Treatments $Therapeutic Activity: 8-22 mins        Raymond Gurney, PT, DPT Acute Rehabilitation Services  Pager: [phone] Office: [phone]   Jewel Baize 11/26/2020, 11:26 AM

## 2020-11-27 LAB — BASIC METABOLIC PANEL
Anion gap: 6 (ref 5–15)
BUN: 18 mg/dL (ref 6–20)
CO2: 27 mmol/L (ref 22–32)
Calcium: 8.3 mg/dL — ABNORMAL LOW (ref 8.9–10.3)
Chloride: 104 mmol/L (ref 98–111)
Creatinine, Ser: 1.1 mg/dL (ref 0.61–1.24)
GFR, Estimated: >60 mL/min (ref >60)
Glucose, Bld: 118 mg/dL — ABNORMAL HIGH (ref 70–99)
Potassium: 3.9 mmol/L (ref 3.5–5.1)
Sodium: 137 mmol/L (ref 135–145)

## 2020-11-27 LAB — PHOSPHORUS: Phosphorus: 3.5 mg/dL (ref 2.5–4.6)

## 2020-11-27 LAB — CBC
HCT: 30.6 % — ABNORMAL LOW (ref 39.0–52.0)
Hemoglobin: 9.7 g/dL — ABNORMAL LOW (ref 13.0–17.0)
MCH: 27.2 pg (ref 26.0–34.0)
MCHC: 31.7 g/dL (ref 30.0–36.0)
MCV: 85.7 fL (ref 80.0–100.0)
Platelets: 240 10*3/uL (ref 150–400)
RBC: 3.57 MIL/uL — ABNORMAL LOW (ref 4.22–5.81)
RDW: 15.8 % — ABNORMAL HIGH (ref 11.5–15.5)
WBC: 12.8 10*3/uL — ABNORMAL HIGH (ref 4.0–10.5)
nRBC: 0 % (ref 0.0–0.2)

## 2020-11-27 LAB — MAGNESIUM: Magnesium: 2 mg/dL (ref 1.7–2.4)

## 2020-11-27 MED ORDER — METOPROLOL TARTRATE 25 MG PO TABS
12.5000 mg | ORAL_TABLET | Freq: Two times a day (BID) | ORAL | Status: DC
  Administered 2020-11-27 – 2020-11-29 (×4): 12.5 mg via ORAL
  Filled 2020-11-27 (×4): qty 1

## 2020-11-27 MED ORDER — ENOXAPARIN SODIUM 30 MG/0.3ML IJ SOSY: 30.0000 mg | PREFILLED_SYRINGE | INTRAMUSCULAR | Status: DC

## 2020-11-27 MED ORDER — ENOXAPARIN SODIUM 80 MG/0.8ML IJ SOSY
75.0000 mg | PREFILLED_SYRINGE | Freq: Every day | INTRAMUSCULAR | Status: DC
  Administered 2020-11-27 – 2020-12-15 (×17): 75 mg via SUBCUTANEOUS
  Filled 2020-11-27 (×18): qty 0.8

## 2020-11-27 MED ORDER — LOSARTAN POTASSIUM 50 MG PO TABS
100.0000 mg | ORAL_TABLET | Freq: Every day | ORAL | Status: DC
  Administered 2020-11-27 – 2020-11-29 (×3): 100 mg via ORAL
  Filled 2020-11-27 (×3): qty 2

## 2020-11-27 MED ORDER — HYDROCHLOROTHIAZIDE 25 MG PO TABS
25.0000 mg | ORAL_TABLET | Freq: Every day | ORAL | Status: DC
  Administered 2020-11-27 – 2020-11-29 (×3): 25 mg via ORAL
  Filled 2020-11-27 (×3): qty 1

## 2020-11-27 MED ORDER — ENOXAPARIN SODIUM 80 MG/0.8ML IJ SOSY: 75.0000 mg | PREFILLED_SYRINGE | Freq: Every day | INTRAMUSCULAR | Status: DC

## 2020-11-27 MED ORDER — HYDROMORPHONE HCL 1 MG/ML IJ SOLN
1.0000 mg | INTRAMUSCULAR | Status: DC | PRN
  Administered 2020-11-27 – 2020-11-29 (×7): 1 mg via INTRAVENOUS
  Filled 2020-11-27 (×7): qty 1

## 2020-11-27 NOTE — Progress Notes (Signed)
Patients heart rate maintaining between 130-140s Md notified and home medications started back.  Yvan Dority, Kae Heller, RN

## 2020-11-27 NOTE — Progress Notes (Signed)
2 Days Post-Op   Subjective/Chief Complaint: Complains of pain at incision   Objective: Vital signs in last 24 hours: Temp:  [97.8 F (36.6 C)-99 F (37.2 C)] 98.6 F (37 C) (06/26 0710) Pulse Rate:  [102-107] 105 (06/26 0710) Resp:  [17-22] 21 (06/26 0710) BP: (140-182)/(79-108) 182/97 (06/26 0710) SpO2:  [93 %-96 %] 93 % (06/26 0710) Last BM Date:  (PTA)  Intake/Output from previous day: 06/25 0701 - 06/26 0700 In: 1756.6 [I.V.:1254.2; IV Piggyback:502.4] Out: 900 [Urine:900] Intake/Output this shift: No intake/output data recorded.  General appearance: alert and cooperative Resp: clear to auscultation bilaterally Cardio: regular rate and rhythm GI: soft, very tender at incision  Lab Results:  Recent Labs    11/26/20 0205 11/27/20 0227  WBC 18.8* 12.8*  HGB 12.7* 9.7*  HCT 38.6* 30.6*  PLT 313 240   BMET Recent Labs    11/26/20 0205 11/27/20 0227  NA 135 137  K 3.8 3.9  CL 101 104  CO2 23 27  GLUCOSE 145* 118*  BUN 14 18  CREATININE 1.13 1.10  CALCIUM 8.7* 8.3*   PT/INR No results for input(s): LABPROT, INR in the last 72 hours. ABG No results for input(s): PHART, HCO3 in the last 72 hours.  Invalid input(s): PCO2, PO2  Studies/Results: No results found.  Anti-infectives: Anti-infectives (From admission, onward)    Start     Dose/Rate Route Frequency Ordered Stop   11/26/20 0600  ceFAZolin (ANCEF) IVPB 3g/100 mL premix        3 g 200 mL/hr over 30 Minutes Intravenous On call to O.R. 11/25/20 1203 11/25/20 1338       Assessment/Plan: s/p Procedure(s): COLOSTOMY REVERSAL (N/A) HERNIA REPAIR PARASTOMAL (N/A) PROCTOSCOPY (N/A) Continue bowel rest Remote h/o polytrauma with colostomy and parastomal hernia  - s/p exlap, colostomy reversal, and parastomal hernia repair. Poor pain control, meds not being maximized. Discussed with nursing. Foley - d/c FEN - CLD, replete mag, phos DVT - SCDs, LMWH to start in AM Dispo - 4NP  POD 2  LOS: 2  days    Jesse Waters 11/27/2020

## 2020-11-27 NOTE — Progress Notes (Signed)
Patient went to bathroom passed a very small amount of blood tinged stool.  Mackenzie Lia, Kae Heller, RN

## 2020-11-27 NOTE — Plan of Care (Signed)
  Problem: Education: Goal: Knowledge of General Education information will improve Description Including pain rating scale, medication(s)/side effects and non-pharmacologic comfort measures Outcome: Progressing   Problem: Health Behavior/Discharge Planning: Goal: Ability to manage health-related needs will improve Outcome: Progressing   

## 2020-11-27 NOTE — Progress Notes (Signed)
Physical Therapy Treatment Patient Details Name: Jesse Waters MRN: [id number] DOB: Jul 26, 1995 Today's Date: 11/27/2020    History of Present Illness This 25 y.o. male admitted 11/22/20 for colostomy reversal and hernia repair.  PMH includes:  HTN, PNA    PT Comments    Pt demonstrating improved functional mobility speed, safety, and independence this date as his pain has improved and he is no longer having orthostatic hypotension symptoms. Pt was able to perform all bed mobility with min-modAx1 and transfers and gait up to ~70 ft with a RW with min guard-supervision. Pt with slow gait speed and poor feet clearance, but expect it to improve as the pain and his fatigue continue to improve. Noted bright red blood in stool, notified RN. Will continue to follow acutely. Current recommendations remain appropriate.    Follow Up Recommendations  No PT follow up;Supervision for mobility/OOB     Equipment Recommendations  3in1 (PT)    Recommendations for Other Services       Precautions / Restrictions Precautions Precautions: Fall Precaution Comments: abdominal binder; monitor BP Restrictions Weight Bearing Restrictions: No    Mobility  Bed Mobility Overal bed mobility: Needs Assistance Bed Mobility: Rolling;Sidelying to Sit;Sit to Supine Rolling: Min assist Sidelying to sit: Mod assist;HOB elevated   Sit to supine: Mod assist   General bed mobility comments: Pt needing extra time to complete bed mobility, but able to roll with minA to pull on PT's hand and transition sidelying > sit EOB pulling up on PT's hand with HOB elevated with modA. ModA to manage legs back to supine.    Transfers Overall transfer level: Needs assistance Equipment used: Rolling walker (2 wheeled) Transfers: Sit to/from Stand Sit to Stand: Min guard;Supervision         General transfer comment: Sit to stand 1x from EOB and 2x from commode without LOB, increased time to rise, min  guard-supervision.  Ambulation/Gait Ambulation/Gait assistance: Min guard;Supervision Gait Distance (Feet): 70 Feet (x2 bouts of ~70 ft > ~15 ft) Assistive device: Rolling walker (2 wheeled) Gait Pattern/deviations: Step-through pattern;Decreased stride length;Shuffle Gait velocity: reduced Gait velocity interpretation: 1.31 - 2.62 ft/sec, indicative of limited community ambulator General Gait Details: Pt with slow gait and decreased bil feet clearance. Cues to correct. No LOB, min guard-supervision for safety.   Stairs             Wheelchair Mobility    Modified Rankin (Stroke Patients Only)       Balance Overall balance assessment: Needs assistance Sitting-balance support: No upper extremity supported;Feet supported Sitting balance-Leahy Scale: Fair Sitting balance - Comments: Static sitting EOB no LOB, supervision.   Standing balance support: Bilateral upper extremity supported;No upper extremity supported;During functional activity Standing balance-Leahy Scale: Fair Standing balance comment: Able to stand statically without UE support with wide BOS and supervision, needing UE support for mobility.                            Cognition Arousal/Alertness: Awake/alert Behavior During Therapy: Flat affect Overall Cognitive Status: Within Functional Limits for tasks assessed                                 General Comments: Pt with decreased anxiety as pain has improved this date. Pt still with flat affect, but more conversant this date.      Exercises      General Comments  General comments (skin integrity, edema, etc.): BP 170s/100s start of session, denies any lightheadedness, nausea, or headache throughout      Pertinent Vitals/Pain Pain Assessment: Faces Faces Pain Scale: Hurts even more Pain Location: abdomen Pain Descriptors / Indicators: Discomfort;Guarding;Grimacing;Moaning Pain Intervention(s): Limited activity within  patient's tolerance;Monitored during session;Repositioned;Premedicated before session    Home Living                      Prior Function            PT Goals (current goals can now be found in the care plan section) Acute Rehab PT Goals Patient Stated Goal: to go to the bathroom PT Goal Formulation: With patient Time For Goal Achievement: 12/10/20 Potential to Achieve Goals: Good Progress towards PT goals: Progressing toward goals    Frequency    Min 3X/week      PT Plan Current plan remains appropriate    Co-evaluation              AM-PAC PT "6 Clicks" Mobility   Outcome Measure  Help needed turning from your back to your side while in a flat bed without using bedrails?: A Little Help needed moving from lying on your back to sitting on the side of a flat bed without using bedrails?: A Lot Help needed moving to and from a bed to a chair (including a wheelchair)?: A Little Help needed standing up from a chair using your arms (e.g., wheelchair or bedside chair)?: A Little Help needed to walk in hospital room?: A Little Help needed climbing 3-5 steps with a railing? : A Lot 6 Click Score: 16    End of Session   Activity Tolerance: Patient limited by pain Patient left: in bed;with call bell/phone within reach;with bed alarm set Nurse Communication: Mobility status;Other (comment) (bright red blood noted in stool) PT Visit Diagnosis: Muscle weakness (generalized) (M62.81);Difficulty in walking, not elsewhere classified (R26.2);Pain;Unsteadiness on feet (R26.81);Other abnormalities of gait and mobility (R26.89) Pain - part of body:  (abdomen)     Time: 0822-0903 PT Time Calculation (min) (ACUTE ONLY): 41 min  Charges:  $Gait Training: 23-37 mins $Therapeutic Activity: 8-22 mins                     Raymond Gurney, PT, DPT Acute Rehabilitation Services  Pager: [phone] Office: [phone]    Jewel Baize 11/27/2020, 9:08 AM

## 2020-11-28 ENCOUNTER — Inpatient Hospital Stay (HOSPITAL_COMMUNITY): Payer: No Typology Code available for payment source

## 2020-11-28 DIAGNOSIS — R000 Tachycardia, unspecified: Secondary | ICD-10-CM

## 2020-11-28 LAB — BLOOD GAS, ARTERIAL
Acid-base deficit: 0.6 mmol/L (ref 0.0–2.0)
Allens test (pass/fail): POSITIVE — AB
Bicarbonate: 23.4 mmol/L (ref 20.0–28.0)
Drawn by: 519031
FIO2: 28
O2 Saturation: 96.3 %
Patient temperature: 39
pCO2 arterial: 41.4 mmHg (ref 32.0–48.0)
pH, Arterial: 7.382 (ref 7.350–7.450)
pO2, Arterial: 90 mmHg (ref 83.0–108.0)

## 2020-11-28 LAB — CBC
HCT: 27 % — ABNORMAL LOW (ref 39.0–52.0)
Hemoglobin: 8.8 g/dL — ABNORMAL LOW (ref 13.0–17.0)
MCH: 27.9 pg (ref 26.0–34.0)
MCHC: 32.6 g/dL (ref 30.0–36.0)
MCV: 85.7 fL (ref 80.0–100.0)
Platelets: 223 10*3/uL (ref 150–400)
RBC: 3.15 MIL/uL — ABNORMAL LOW (ref 4.22–5.81)
RDW: 15.6 % — ABNORMAL HIGH (ref 11.5–15.5)
WBC: 11.6 10*3/uL — ABNORMAL HIGH (ref 4.0–10.5)
nRBC: 0 % (ref 0.0–0.2)

## 2020-11-28 LAB — BASIC METABOLIC PANEL
Anion gap: 6 (ref 5–15)
BUN: 16 mg/dL (ref 6–20)
CO2: 29 mmol/L (ref 22–32)
Calcium: 8.4 mg/dL — ABNORMAL LOW (ref 8.9–10.3)
Chloride: 102 mmol/L (ref 98–111)
Creatinine, Ser: 1.1 mg/dL (ref 0.61–1.24)
GFR, Estimated: >60 mL/min (ref >60)
Glucose, Bld: 102 mg/dL — ABNORMAL HIGH (ref 70–99)
Potassium: 4 mmol/L (ref 3.5–5.1)
Sodium: 137 mmol/L (ref 135–145)

## 2020-11-28 LAB — MAGNESIUM: Magnesium: 1.9 mg/dL (ref 1.7–2.4)

## 2020-11-28 LAB — PHOSPHORUS: Phosphorus: 2.9 mg/dL (ref 2.5–4.6)

## 2020-11-28 MED ORDER — METHOCARBAMOL 500 MG PO TABS
1000.0000 mg | ORAL_TABLET | Freq: Three times a day (TID) | ORAL | Status: DC
  Administered 2020-11-28 – 2020-11-30 (×5): 1000 mg via ORAL
  Filled 2020-11-28 (×6): qty 2

## 2020-11-28 MED ORDER — MAGNESIUM SULFATE IN D5W 1-5 GM/100ML-% IV SOLN
1.0000 g | Freq: Once | INTRAVENOUS | Status: AC
  Administered 2020-11-28: 1 g via INTRAVENOUS
  Filled 2020-11-28: qty 100

## 2020-11-28 MED ORDER — POTASSIUM & SODIUM PHOSPHATES 280-160-250 MG PO PACK
1.0000 | PACK | Freq: Three times a day (TID) | ORAL | Status: AC
  Administered 2020-11-28 (×3): 1 via ORAL
  Filled 2020-11-28 (×4): qty 1

## 2020-11-28 MED ORDER — ALUM & MAG HYDROXIDE-SIMETH 200-200-20 MG/5ML PO SUSP
15.0000 mL | Freq: Four times a day (QID) | ORAL | Status: DC | PRN
Start: 2020-11-28 — End: 2020-11-30
  Administered 2020-11-28 – 2020-11-29 (×2): 15 mL via ORAL
  Filled 2020-11-28 (×2): qty 30

## 2020-11-28 NOTE — Progress Notes (Signed)
Trauma/Critical Care Follow Up Note  Subjective:    Overnight Issues:   Objective:  Vital signs for last 24 hours: Temp:  [98.9 F (37.2 C)-102.9 F (39.4 C)] 100.2 F (37.9 C) (06/27 0342) Pulse Rate:  [105-140] 125 (06/27 0342) Resp:  [12-40] 21 (06/27 0342) BP: (94-166)/(51-95) 147/51 (06/27 0342) SpO2:  [94 %-97 %] 94 % (06/27 0342)  Hemodynamic parameters for last 24 hours:    Intake/Output from previous day: 06/26 0701 - 06/27 0700 In: 326.5 [IV Piggyback:326.5] Out: 700 [Urine:700]  Intake/Output this shift: No intake/output data recorded.  Vent settings for last 24 hours:    Physical Exam:  Gen: comfortable, no distress Neuro: non-focal exam HEENT: PERRL Neck: supple CV: RRR Pulm: unlabored breathing Abd: soft, appropriately TTP, incision c/d/I, ostomy wick removed yest, no s/s of infection GU: clear yellow urine Extr: wwp, no edema   Results for orders placed or performed during the hospital encounter of 11/25/20 (from the past 24 hour(s))  CBC     Status: Abnormal   Collection Time: 11/28/20  3:46 AM  Result Value Ref Range   WBC 11.6 (H) 4.0 - 10.5 K/uL   RBC 3.15 (L) 4.22 - 5.81 MIL/uL   Hemoglobin 8.8 (L) 13.0 - 17.0 g/dL   HCT 06.2 (L) 69.4 - 85.4 %   MCV 85.7 80.0 - 100.0 fL   MCH 27.9 26.0 - 34.0 pg   MCHC 32.6 30.0 - 36.0 g/dL   RDW 62.7 (H) 03.5 - 00.9 %   Platelets 223 150 - 400 K/uL   nRBC 0.0 0.0 - 0.2 %  Basic metabolic panel     Status: Abnormal   Collection Time: 11/28/20  3:46 AM  Result Value Ref Range   Sodium 137 135 - 145 mmol/L   Potassium 4.0 3.5 - 5.1 mmol/L   Chloride 102 98 - 111 mmol/L   CO2 29 22 - 32 mmol/L   Glucose, Bld 102 (H) 70 - 99 mg/dL   BUN 16 6 - 20 mg/dL   Creatinine, Ser 3.81 0.61 - 1.24 mg/dL   Calcium 8.4 (L) 8.9 - 10.3 mg/dL   GFR, Estimated >82 >99 mL/min   Anion gap 6 5 - 15  Magnesium     Status: None   Collection Time: 11/28/20  3:46 AM  Result Value Ref Range   Magnesium 1.9 1.7 - 2.4  mg/dL  Phosphorus     Status: None   Collection Time: 11/28/20  3:46 AM  Result Value Ref Range   Phosphorus 2.9 2.5 - 4.6 mg/dL    Assessment & Plan:  Present on Admission:  History of colostomy reversal    LOS: 3 days   Additional comments:I reviewed the patient's new clinical lab test results.   and I reviewed the patients new imaging test results.    Remote h/o polytrauma with colostomy and parastomal hernia  - s/p exlap, colostomy reversal, and parastomal hernia repair. +BM Tachycardia - metoprolol started last PM, persistent. Check LE venous duplex, CXR, resp cx, and CXR. Reports pain well controlled. If still no source, check CTA chest and CT A/P. FEN - FLD, replete mag, phos DVT - SCDs, LMWH to start in AM Dispo - 4NP     Diamantina Monks, MD Trauma & General Surgery Please use [email or web address] to contact on call provider  11/28/2020  *Care during the described time interval was provided by me. I have reviewed this patient's available data, including medical history, events of note, physical  examination and test results as part of my evaluation.

## 2020-11-28 NOTE — Progress Notes (Signed)
Attempted to assist pt out of bed. Pt was only able to sit on sit of bed and became dizzy. Pt requested to lay back down.

## 2020-11-28 NOTE — Progress Notes (Signed)
Lower extremity venous bilateral study completed.   Please see CV Proc for preliminary results.   Ocean Kearley, RDMS, RVT  

## 2020-11-28 NOTE — Progress Notes (Signed)
Patient HR remains tachycardic,sustained in the 140s. Respirations also in the 40s sustained. Trauma (MD) notified. Dr Janee Morn stated that the Team will make rounding on patient early. Will continue monitoring.

## 2020-11-29 ENCOUNTER — Inpatient Hospital Stay (HOSPITAL_COMMUNITY): Payer: No Typology Code available for payment source

## 2020-11-29 ENCOUNTER — Inpatient Hospital Stay: Payer: Self-pay

## 2020-11-29 LAB — CBC
HCT: 26 % — ABNORMAL LOW (ref 39.0–52.0)
Hemoglobin: 8.3 g/dL — ABNORMAL LOW (ref 13.0–17.0)
MCH: 27 pg (ref 26.0–34.0)
MCHC: 31.9 g/dL (ref 30.0–36.0)
MCV: 84.7 fL (ref 80.0–100.0)
Platelets: 290 10*3/uL (ref 150–400)
RBC: 3.07 MIL/uL — ABNORMAL LOW (ref 4.22–5.81)
RDW: 15.5 % (ref 11.5–15.5)
WBC: 13.5 10*3/uL — ABNORMAL HIGH (ref 4.0–10.5)
nRBC: 0 % (ref 0.0–0.2)

## 2020-11-29 LAB — BASIC METABOLIC PANEL
Anion gap: 9 (ref 5–15)
BUN: 36 mg/dL — ABNORMAL HIGH (ref 6–20)
CO2: 25 mmol/L (ref 22–32)
Calcium: 8.5 mg/dL — ABNORMAL LOW (ref 8.9–10.3)
Chloride: 100 mmol/L (ref 98–111)
Creatinine, Ser: 1.64 mg/dL — ABNORMAL HIGH (ref 0.61–1.24)
GFR, Estimated: 59 mL/min — ABNORMAL LOW (ref >60)
Glucose, Bld: 121 mg/dL — ABNORMAL HIGH (ref 70–99)
Potassium: 3.5 mmol/L (ref 3.5–5.1)
Sodium: 134 mmol/L — ABNORMAL LOW (ref 135–145)

## 2020-11-29 LAB — SURGICAL PATHOLOGY

## 2020-11-29 MED ORDER — DIPHENHYDRAMINE HCL 12.5 MG/5ML PO ELIX: 12.5000 mg | ORAL_SOLUTION | Freq: Four times a day (QID) | ORAL | Status: DC | PRN

## 2020-11-29 MED ORDER — PIPERACILLIN-TAZOBACTAM 3.375 G IVPB
3.3750 g | Freq: Three times a day (TID) | INTRAVENOUS | Status: DC
  Administered 2020-11-29 – 2020-12-06 (×21): 3.375 g via INTRAVENOUS
  Filled 2020-11-29 (×20): qty 50

## 2020-11-29 MED ORDER — ONDANSETRON HCL 4 MG/2ML IJ SOLN: 4.0000 mg | Freq: Four times a day (QID) | INTRAMUSCULAR | Status: DC | PRN

## 2020-11-29 MED ORDER — IOHEXOL 9 MG/ML PO SOLN
ORAL | Status: AC
  Filled 2020-11-29: qty 1000

## 2020-11-29 MED ORDER — SODIUM CHLORIDE 0.9% FLUSH: 9.0000 mL | INTRAVENOUS | Status: DC | PRN

## 2020-11-29 MED ORDER — DIPHENHYDRAMINE HCL 50 MG/ML IJ SOLN: 12.5000 mg | Freq: Four times a day (QID) | INTRAMUSCULAR | Status: DC | PRN

## 2020-11-29 MED ORDER — SODIUM CHLORIDE 0.9% FLUSH
5.0000 mL | Freq: Three times a day (TID) | INTRAVENOUS | Status: DC
  Administered 2020-11-29 – 2020-12-15 (×45): 5 mL

## 2020-11-29 MED ORDER — IOHEXOL 350 MG/ML SOLN
100.0000 mL | Freq: Once | INTRAVENOUS | Status: AC | PRN
  Administered 2020-11-29: 100 mL via INTRAVENOUS

## 2020-11-29 MED ORDER — FENTANYL CITRATE (PF) 100 MCG/2ML IJ SOLN
INTRAMUSCULAR | Status: AC
  Filled 2020-11-29: qty 4

## 2020-11-29 MED ORDER — METOPROLOL TARTRATE 5 MG/5ML IV SOLN
5.0000 mg | Freq: Four times a day (QID) | INTRAVENOUS | Status: DC | PRN
  Administered 2020-11-29 – 2020-12-12 (×14): 5 mg via INTRAVENOUS
  Filled 2020-11-29 (×17): qty 5

## 2020-11-29 MED ORDER — PIPERACILLIN-TAZOBACTAM 3.375 G IVPB 30 MIN
3.3750 g | Freq: Once | INTRAVENOUS | Status: AC
  Administered 2020-11-29: 3.375 g via INTRAVENOUS
  Filled 2020-11-29 (×2): qty 50

## 2020-11-29 MED ORDER — MIDAZOLAM HCL 2 MG/2ML IJ SOLN
INTRAMUSCULAR | Status: AC
  Filled 2020-11-29: qty 6

## 2020-11-29 MED ORDER — MIDAZOLAM HCL 2 MG/2ML IJ SOLN
INTRAMUSCULAR | Status: AC | PRN
  Administered 2020-11-29 (×2): 0.5 mg via INTRAVENOUS
  Administered 2020-11-29: 1 mg via INTRAVENOUS

## 2020-11-29 MED ORDER — HYDROMORPHONE 1 MG/ML IV SOLN
INTRAVENOUS | Status: DC
  Filled 2020-11-29: qty 30

## 2020-11-29 MED ORDER — FENTANYL CITRATE (PF) 100 MCG/2ML IJ SOLN
INTRAMUSCULAR | Status: AC | PRN
  Administered 2020-11-29 (×2): 25 ug via INTRAVENOUS

## 2020-11-29 MED ORDER — NALOXONE HCL 0.4 MG/ML IJ SOLN: 0.4000 mg | INTRAMUSCULAR | Status: DC | PRN

## 2020-11-29 MED ORDER — HYDROMORPHONE HCL 1 MG/ML IJ SOLN
1.0000 mg | INTRAMUSCULAR | Status: DC | PRN
  Administered 2020-11-29 (×2): 1 mg via INTRAVENOUS
  Filled 2020-11-29 (×2): qty 1

## 2020-11-29 NOTE — TOC Initial Note (Signed)
Transition of Care [facility]) - Initial/Assessment Note    Patient Details  Name: Jesse Waters MRN: [id number] Date of Birth: Nov 03, 1995  [facility] CM/SW Contact:    Glennon Mac, RN Phone Number: 11/29/2020, 3:47 PM  Clinical Narrative:   Patient admitted on 11/25/2020 after exploratory lap, colostomy reversal, and parastomal hernia repair.  Prior to admission, patient independent and living at home with mother.  Sister at bedside; states he will have assistance at discharge provided by family.  Will follow as patient progresses.              Expected Discharge Plan: Home w Home Health Services Barriers to Discharge: Continued Medical Work up           Expected Discharge Plan and Services Expected Discharge Plan: Home w Home Health Services   Discharge Planning Services: CM Consult   Living arrangements for the past 2 months: Single Family Home                                      Prior Living Arrangements/Services Living arrangements for the past 2 months: Single Family Home Lives with:: Parents Patient language and need for interpreter reviewed:: Yes Do you feel safe going back to the place where you live?: Yes      Need for Family Participation in Patient Care: Yes (Comment) Care giver support system in place?: Yes (comment)   Criminal Activity/Legal Involvement Pertinent to Current Situation/Hospitalization: No - Comment as needed  Activities of Daily Living      Permission Sought/Granted                  Emotional Assessment Appearance:: Appears stated age Attitude/Demeanor/Rapport: Engaged Affect (typically observed): Accepting Orientation: : Oriented to Self, Oriented to Place, Oriented to  Time, Oriented to Situation      Admission diagnosis:  History of colostomy reversal [Z98.890] S/P exploratory laparotomy [Z98.890] Patient Active Problem List   Diagnosis Date Noted   History of colostomy reversal 11/25/2020   S/P  exploratory laparotomy 11/25/2020   PCP:  Coralie Carpen, MD Pharmacy:   [facility] [address], [place] - [address] [place] [address] Phone: [phone] Fax: [phone]     Social Determinants of Health (SDOH) Interventions    Readmission Risk Interventions No flowsheet data found.  Quintella Baton, RN, BSN  Trauma/Neuro ICU Case Manager [phone]

## 2020-11-29 NOTE — Progress Notes (Signed)
Orthopedic Tech Progress Note Patient Details:  Jesse Waters July 07, 1995 [id number]  Ortho Devices Type of Ortho Device: Abdominal binder Ortho Device/Splint Location: delivered replacement binder Ortho Device/Splint Interventions: Ordered, Application, Adjustment   Post Interventions Patient Tolerated: Well Instructions Provided: Care of device, Adjustment of device  Trinna Post 11/29/2020, 6:43 AM

## 2020-11-29 NOTE — Progress Notes (Signed)
   Trauma/Critical Care Follow Up Note  Subjective:    Overnight Issues:   Objective:  Vital signs for last 24 hours: Temp:  [98.9 F (37.2 C)-100.8 F (38.2 C)] 100.8 F (38.2 C) (06/28 0755) Pulse Rate:  [114-131] 131 (06/28 0755) Resp:  [12-38] 38 (06/28 0755) BP: (94-190)/(59-90) 190/78 (06/28 0755) SpO2:  [94 %-99 %] 94 % (06/28 0755)  Hemodynamic parameters for last 24 hours:    Intake/Output from previous day: 06/27 0701 - 06/28 0700 In: 5096.9 [P.O.:360; I.V.:4736.9] Out: 1125 [Urine:1125]  Intake/Output this shift: No intake/output data recorded.  Vent settings for last 24 hours:    Physical Exam:  Gen: comfortable, no distress Neuro: non-focal exam HEENT: PERRL Neck: supple CV: RRR Pulm: mildly labored breathing Abd: soft, NT, moderate drainage from central portion of incision GU: clear yellow urine Extr: wwp, no edema   Results for orders placed or performed during the hospital encounter of 11/25/20 (from the past 24 hour(s))  CBC     Status: Abnormal   Collection Time: 11/29/20  5:12 AM  Result Value Ref Range   WBC 13.5 (H) 4.0 - 10.5 K/uL   RBC 3.07 (L) 4.22 - 5.81 MIL/uL   Hemoglobin 8.3 (L) 13.0 - 17.0 g/dL   HCT 99.3 (L) 71.6 - 96.7 %   MCV 84.7 80.0 - 100.0 fL   MCH 27.0 26.0 - 34.0 pg   MCHC 31.9 30.0 - 36.0 g/dL   RDW 89.3 81.0 - 17.5 %   Platelets 290 150 - 400 K/uL   nRBC 0.0 0.0 - 0.2 %  Basic metabolic panel     Status: Abnormal   Collection Time: 11/29/20  5:12 AM  Result Value Ref Range   Sodium 134 (L) 135 - 145 mmol/L   Potassium 3.5 3.5 - 5.1 mmol/L   Chloride 100 98 - 111 mmol/L   CO2 25 22 - 32 mmol/L   Glucose, Bld 121 (H) 70 - 99 mg/dL   BUN 36 (H) 6 - 20 mg/dL   Creatinine, Ser 1.02 (H) 0.61 - 1.24 mg/dL   Calcium 8.5 (L) 8.9 - 10.3 mg/dL   GFR, Estimated 59 (L) >60 mL/min   Anion gap 9 5 - 15    Assessment & Plan: The plan of care was discussed with the bedside nurse for the day, who is in agreement with this  plan and no additional concerns were raised.   Present on Admission:  History of colostomy reversal    LOS: 4 days   Additional comments:I reviewed the patient's new clinical lab test results.   and I reviewed the patients new imaging test results.    Remote h/o polytrauma with colostomy and parastomal hernia  - s/p exlap, colostomy reversal, and parastomal hernia repair. +BM Tachycardia - on metoprolol, persistent. LE venous duplex, CXR, ABG negative yesterday. CTA chest, CT A/P with IV/PO contrast today. Empiric zosyn.   FEN - NPO DVT - SCDs, LMWH Dispo - 4NP    Diamantina Monks, MD Trauma & General Surgery Please use [email or web address] to contact on call provider  11/29/2020  *Care during the described time interval was provided by me. I have reviewed this patient's available data, including medical history, events of note, physical examination and test results as part of my evaluation.

## 2020-11-29 NOTE — Progress Notes (Signed)
CT C/A/P reviewed. Obvious fluid collection c/w anastomotic leak. Unable to visualize size of leak on imaging. Will plan for NGT, IR drainage, cont abx, and re-eval anastomosis via drain vs repeat CT A/P with PO contrast via NG. PICC order placed and plan for TPN.   Diamantina Monks, MD General and Trauma Surgery [facility] Surgery

## 2020-11-29 NOTE — Progress Notes (Signed)
OT Cancellation Note  Patient Details Name: Jesse Waters MRN: [id number] DOB: 04-Oct-1995   Cancelled Treatment:    Reason Eval/Treat Not Completed: Medical issues which prohibited therapy (Pt tachy in 130's at rest and endorsing pain; Will follow up.)   Flora Lipps, OTR/L Acute Rehabilitation Services Pager: [phone] Office: [phone]  Flora Lipps 11/29/2020, 3:26 PM

## 2020-11-29 NOTE — Progress Notes (Signed)
Pharmacy Antibiotic Note  Jesse Waters is a 25 y.o. male admitted on 11/25/2020 for surgery, now with concern for  intra-abdominal infection .  Pharmacy has been consulted for Zosyn dosing.  Plan: Zosyn 3.375g IV q8h (4 hour infusion).  Height: 5\' 9"  (175.3 cm) Weight: (!) 153.2 kg (337 lb 11.2 oz) IBW/kg (Calculated) : 70.7  Temp (24hrs), Avg:99.5 F (37.5 C), Min:98.9 F (37.2 C), Max:100.2 F (37.9 C)  Recent Labs  Lab 11/22/20 1055 11/26/20 0205 11/27/20 0227 11/28/20 0346 11/29/20 0512  WBC 10.8* 18.8* 12.8* 11.6* 13.5*  CREATININE 1.06 1.13 1.10 1.10 1.64*    Estimated Creatinine Clearance: 100.1 mL/min (A) (by C-G formula based on SCr of 1.64 mg/dL (H)).    No Known Allergies   Thank you for allowing pharmacy to be a part of this patient's care.  12/01/20, PharmD, BCPS  11/29/2020 6:46 AM

## 2020-11-29 NOTE — Progress Notes (Signed)
Trauma Response Nurse Note-  Reason for Call / Reason for Trauma activation:   - NG tube placement  Initial Focused Assessment (If applicable, or please see trauma documentation):  - HR 130's - Hypertensive - Pain in abd  Interventions:  - NGT placement @ 80cm - Placed NGT to suction with 75cc out. - Transported pt to CT with IR staff - Helped to clean up pt - multiple bloody, liquid stools  Plan of Care as of this note:  - CT guided RLQ drain to be placed - Back to 4NP - LWS of NGT - Flush NGT for patency with sterile water - Dilauded PCA - Keep pt NPO - PICC insertion - TPN  Event Summary:   - Pt in CT pre op for drain placement to abd drain.  TRN called by Dr. Bedelia Person.  Orders to place NGT.  When NGT was placed, pt had large BM consisting of watery, dark red liquid stool.  Pt was cleaned up and taken to CT for his drain to be placed.  NGT was placed to suction and only 100cc out.  Pt on his way back to 4NP at this time.     Please call TRN for further assistance. [phone]

## 2020-11-29 NOTE — Progress Notes (Signed)
PT Cancellation Note  Patient Details Name: Jesse Waters MRN: [id number] DOB: 1995-08-11   Cancelled Treatment:    Reason Eval/Treat Not Completed: Medical issues which prohibited therapy (pt tachy in 130's at rest and endorsing pain; Will follow up).  Lillia Pauls, PT, DPT Acute Rehabilitation Services Pager [phone] Office [phone]    Norval Morton 11/29/2020, 10:24 AM
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
## 2020-11-29 NOTE — Progress Notes (Signed)
Upon assessment, patient was noted/observed to be bleeding from the incision site. MD notified. Dr. Cliffton Asters instructed to cover it with gauges.  Will continue monitoring.

## 2020-11-29 NOTE — TOC CAGE-AID Note (Signed)
Transition of Care [facility]) - CAGE-AID Screening   Patient Details  Name: Jesse Waters MRN: [id number] Date of Birth: 15-Oct-1995  Transition of Care [facility]) CM/SW Contact:    Glennon Mac, RN Phone Number: 11/29/2020, 3:51 PM   Clinical Narrative: Patient unable to participate in substance abuse screening, as he has just had pain medicine.   CAGE-AID Screening: Substance Abuse Screening unable to be completed due to: : Patient unable to participate (Just had pain medicine)  Quintella Baton, RN, BSN  Trauma/Neuro ICU Case Manager [phone]

## 2020-11-29 NOTE — Procedures (Signed)
Interventional Radiology Procedure Note  Procedure: Placement of a 60F drain via RLQ approach.  Drain connected to bag.   Complications: None  Estimated Blood Loss: None  Recommendations: - Drain to bag - Flush at least Q shift   Signed,  Sterling Big, MD

## 2020-11-30 ENCOUNTER — Encounter (HOSPITAL_COMMUNITY): Payer: Self-pay

## 2020-11-30 ENCOUNTER — Encounter (HOSPITAL_COMMUNITY): Admission: RE | Disposition: A | Discharge status: Home / Self Care | Payer: Self-pay | Source: Home / Self Care

## 2020-11-30 ENCOUNTER — Inpatient Hospital Stay (HOSPITAL_COMMUNITY): Payer: No Typology Code available for payment source

## 2020-11-30 ENCOUNTER — Inpatient Hospital Stay (HOSPITAL_COMMUNITY): Payer: No Typology Code available for payment source | Admitting: Anesthesiology

## 2020-11-30 HISTORY — PX: LAPAROTOMY: SHX154

## 2020-11-30 HISTORY — PX: APPLICATION OF WOUND VAC: SHX5189

## 2020-11-30 LAB — COMPREHENSIVE METABOLIC PANEL
ALT: 33 U/L (ref 0–44)
AST: 31 U/L (ref 15–41)
Albumin: 2.2 g/dL — ABNORMAL LOW (ref 3.5–5.0)
Alkaline Phosphatase: 42 U/L (ref 38–126)
Anion gap: 9 (ref 5–15)
BUN: 44 mg/dL — ABNORMAL HIGH (ref 6–20)
CO2: 26 mmol/L (ref 22–32)
Calcium: 8.4 mg/dL — ABNORMAL LOW (ref 8.9–10.3)
Chloride: 104 mmol/L (ref 98–111)
Creatinine, Ser: 1.51 mg/dL — ABNORMAL HIGH (ref 0.61–1.24)
GFR, Estimated: >60 mL/min (ref >60)
Glucose, Bld: 119 mg/dL — ABNORMAL HIGH (ref 70–99)
Potassium: 3.6 mmol/L (ref 3.5–5.1)
Sodium: 139 mmol/L (ref 135–145)
Total Bilirubin: 0.6 mg/dL (ref 0.3–1.2)
Total Protein: 6 g/dL — ABNORMAL LOW (ref 6.5–8.1)

## 2020-11-30 LAB — POCT I-STAT 7, (LYTES, BLD GAS, ICA,H+H)
Acid-Base Excess: 2 mmol/L (ref 0.0–2.0)
Acid-Base Excess: 1 mmol/L (ref 0.0–2.0)
Acid-Base Excess: 4 mmol/L — ABNORMAL HIGH (ref 0.0–2.0)
Bicarbonate: 27.4 mmol/L (ref 20.0–28.0)
Bicarbonate: 26.4 mmol/L (ref 20.0–28.0)
Bicarbonate: 29.8 mmol/L — ABNORMAL HIGH (ref 20.0–28.0)
Calcium, Ion: 1.12 mmol/L — ABNORMAL LOW (ref 1.15–1.40)
Calcium, Ion: 1.09 mmol/L — ABNORMAL LOW (ref 1.15–1.40)
Calcium, Ion: 1.11 mmol/L — ABNORMAL LOW (ref 1.15–1.40)
HCT: 21 % — ABNORMAL LOW (ref 39.0–52.0)
HCT: 26 % — ABNORMAL LOW (ref 39.0–52.0)
HCT: 28 % — ABNORMAL LOW (ref 39.0–52.0)
Hemoglobin: 7.1 g/dL — ABNORMAL LOW (ref 13.0–17.0)
Hemoglobin: 8.8 g/dL — ABNORMAL LOW (ref 13.0–17.0)
Hemoglobin: 9.5 g/dL — ABNORMAL LOW (ref 13.0–17.0)
O2 Saturation: 97 %
O2 Saturation: 97 %
O2 Saturation: 100 %
Patient temperature: 100.9
Potassium: 3.9 mmol/L (ref 3.5–5.1)
Potassium: 3.9 mmol/L (ref 3.5–5.1)
Potassium: 3.8 mmol/L (ref 3.5–5.1)
Sodium: 139 mmol/L (ref 135–145)
Sodium: 139 mmol/L (ref 135–145)
Sodium: 139 mmol/L (ref 135–145)
TCO2: 29 mmol/L (ref 22–32)
TCO2: 28 mmol/L (ref 22–32)
TCO2: 31 mmol/L (ref 22–32)
pCO2 arterial: 44.6 mmHg (ref 32.0–48.0)
pCO2 arterial: 46.1 mmHg (ref 32.0–48.0)
pCO2 arterial: 52.9 mmHg — ABNORMAL HIGH (ref 32.0–48.0)
pH, Arterial: 7.395 (ref 7.350–7.450)
pH, Arterial: 7.366 (ref 7.350–7.450)
pH, Arterial: 7.364 (ref 7.350–7.450)
pO2, Arterial: 91 mmHg (ref 83.0–108.0)
pO2, Arterial: 95 mmHg (ref 83.0–108.0)
pO2, Arterial: 302 mmHg — ABNORMAL HIGH (ref 83.0–108.0)

## 2020-11-30 LAB — DIFFERENTIAL
Abs Immature Granulocytes: 0 10*3/uL (ref 0.00–0.07)
Basophils Absolute: 0 10*3/uL (ref 0.0–0.1)
Basophils Relative: 0 %
Eosinophils Absolute: 0.6 10*3/uL — ABNORMAL HIGH (ref 0.0–0.5)
Eosinophils Relative: 6 %
Lymphocytes Relative: 24 %
Lymphs Abs: 2.4 10*3/uL (ref 0.7–4.0)
Monocytes Absolute: 1.1 10*3/uL — ABNORMAL HIGH (ref 0.1–1.0)
Monocytes Relative: 11 %
Neutro Abs: 5.8 10*3/uL (ref 1.7–7.7)
Neutrophils Relative %: 59 %
nRBC: 0 /100{WBCs}

## 2020-11-30 LAB — PREPARE RBC (CROSSMATCH)

## 2020-11-30 LAB — CBC
HCT: 24.6 % — ABNORMAL LOW (ref 39.0–52.0)
Hemoglobin: 8.1 g/dL — ABNORMAL LOW (ref 13.0–17.0)
MCH: 27.5 pg (ref 26.0–34.0)
MCHC: 32.9 g/dL (ref 30.0–36.0)
MCV: 83.4 fL (ref 80.0–100.0)
Platelets: 334 10*3/uL (ref 150–400)
RBC: 2.95 MIL/uL — ABNORMAL LOW (ref 4.22–5.81)
RDW: 15.5 % (ref 11.5–15.5)
WBC: 9.8 10*3/uL (ref 4.0–10.5)
nRBC: 0.2 % (ref 0.0–0.2)

## 2020-11-30 LAB — PREALBUMIN: Prealbumin: 6 mg/dL — ABNORMAL LOW (ref 18–38)

## 2020-11-30 LAB — GLUCOSE, CAPILLARY
Glucose-Capillary: 111 mg/dL — ABNORMAL HIGH (ref 70–99)
Glucose-Capillary: 125 mg/dL — ABNORMAL HIGH (ref 70–99)
Glucose-Capillary: 143 mg/dL — ABNORMAL HIGH (ref 70–99)

## 2020-11-30 LAB — MAGNESIUM: Magnesium: 3 mg/dL — ABNORMAL HIGH (ref 1.7–2.4)

## 2020-11-30 LAB — TRIGLYCERIDES: Triglycerides: 178 mg/dL — ABNORMAL HIGH (ref <150)

## 2020-11-30 LAB — PHOSPHORUS: Phosphorus: 4.8 mg/dL — ABNORMAL HIGH (ref 2.5–4.6)

## 2020-11-30 LAB — MRSA NEXT GEN BY PCR, NASAL: MRSA by PCR Next Gen: DETECTED — AB

## 2020-11-30 LAB — ABO/RH: ABO/RH(D): O POS

## 2020-11-30 SURGERY — LAPAROTOMY, EXPLORATORY
Anesthesia: General | Site: Abdomen

## 2020-11-30 MED ORDER — 0.9 % SODIUM CHLORIDE (POUR BTL) OPTIME
TOPICAL | Status: DC | PRN
  Administered 2020-11-30: 1000 mL
  Administered 2020-11-30: 3000 mL

## 2020-11-30 MED ORDER — LACTATED RINGERS IV SOLN: INTRAVENOUS | Status: AC

## 2020-11-30 MED ORDER — FENTANYL CITRATE (PF) 100 MCG/2ML IJ SOLN: 50.0000 ug | INTRAMUSCULAR | Status: DC | PRN

## 2020-11-30 MED ORDER — PROPOFOL 1000 MG/100ML IV EMUL
0.0000 ug/kg/min | INTRAVENOUS | Status: DC
  Administered 2020-11-30 (×2): 50 ug/kg/min via INTRAVENOUS
  Administered 2020-11-30: 30 ug/kg/min via INTRAVENOUS
  Administered 2020-11-30 – 2020-12-03 (×25): 50 ug/kg/min via INTRAVENOUS
  Filled 2020-11-30: qty 100
  Filled 2020-11-30: qty 200
  Filled 2020-11-30 (×2): qty 100
  Filled 2020-11-30 (×2): qty 200
  Filled 2020-11-30: qty 100
  Filled 2020-11-30: qty 200
  Filled 2020-11-30 (×5): qty 100
  Filled 2020-11-30 (×3): qty 200
  Filled 2020-11-30: qty 100
  Filled 2020-11-30: qty 200
  Filled 2020-11-30 (×3): qty 100

## 2020-11-30 MED ORDER — HYDROMORPHONE HCL 1 MG/ML IJ SOLN
INTRAMUSCULAR | Status: AC
  Filled 2020-11-30: qty 0.5

## 2020-11-30 MED ORDER — DEXMEDETOMIDINE HCL IN NACL 400 MCG/100ML IV SOLN
0.0000 ug/kg/h | INTRAVENOUS | Status: DC
  Administered 2020-11-30: 0.5 ug/kg/h via INTRAVENOUS
  Filled 2020-11-30: qty 100

## 2020-11-30 MED ORDER — FENTANYL CITRATE (PF) 250 MCG/5ML IJ SOLN
INTRAMUSCULAR | Status: AC
  Filled 2020-11-30: qty 5

## 2020-11-30 MED ORDER — SODIUM CHLORIDE 0.9 % IV SOLN: 10.0000 mL/h | Freq: Once | INTRAVENOUS | Status: DC

## 2020-11-30 MED ORDER — MIDAZOLAM HCL 5 MG/5ML IJ SOLN
INTRAMUSCULAR | Status: DC | PRN
  Administered 2020-11-30 (×2): 2 mg via INTRAVENOUS

## 2020-11-30 MED ORDER — CHLORHEXIDINE GLUCONATE 0.12 % MT SOLN
OROMUCOSAL | Status: AC
  Administered 2020-11-30: 15 mL via OROMUCOSAL
  Filled 2020-11-30: qty 15

## 2020-11-30 MED ORDER — MIDAZOLAM HCL 2 MG/2ML IJ SOLN
2.0000 mg | INTRAMUSCULAR | Status: DC | PRN
  Administered 2020-12-02 (×2): 2 mg via INTRAVENOUS
  Filled 2020-11-30 (×2): qty 2

## 2020-11-30 MED ORDER — METOPROLOL TARTRATE 25 MG/10 ML ORAL SUSPENSION
12.5000 mg | Freq: Two times a day (BID) | ORAL | Status: DC
  Administered 2020-11-30 – 2020-12-01 (×3): 12.5 mg
  Filled 2020-11-30 (×3): qty 10

## 2020-11-30 MED ORDER — ORAL CARE MOUTH RINSE
15.0000 mL | OROMUCOSAL | Status: DC
  Administered 2020-11-30 – 2020-12-08 (×78): 15 mL via OROMUCOSAL

## 2020-11-30 MED ORDER — ALBUMIN HUMAN 5 % IV SOLN: INTRAVENOUS | Status: DC | PRN

## 2020-11-30 MED ORDER — PHENYLEPHRINE HCL-NACL 10-0.9 MG/250ML-% IV SOLN
INTRAVENOUS | Status: DC | PRN
  Administered 2020-11-30: 50 ug/min via INTRAVENOUS

## 2020-11-30 MED ORDER — SODIUM CHLORIDE 0.9 % IV SOLN: INTRAVENOUS | Status: DC | PRN

## 2020-11-30 MED ORDER — TRAVASOL 10 % IV SOLN
INTRAVENOUS | Status: AC
  Filled 2020-11-30: qty 537.6

## 2020-11-30 MED ORDER — HYDROCHLOROTHIAZIDE 25 MG PO TABS
25.0000 mg | ORAL_TABLET | Freq: Every day | ORAL | Status: DC
  Administered 2020-12-01 – 2020-12-04 (×3): 25 mg
  Filled 2020-11-30 (×3): qty 1

## 2020-11-30 MED ORDER — MIDAZOLAM BOLUS VIA INFUSION
0.0000 mg | INTRAVENOUS | Status: DC | PRN
  Filled 2020-11-30: qty 5

## 2020-11-30 MED ORDER — NOREPINEPHRINE 4 MG/250ML-% IV SOLN
0.0000 ug/min | INTRAVENOUS | Status: DC
  Filled 2020-11-30: qty 250

## 2020-11-30 MED ORDER — DEXAMETHASONE SODIUM PHOSPHATE 10 MG/ML IJ SOLN
INTRAMUSCULAR | Status: DC | PRN
  Administered 2020-11-30: 4 mg via INTRAVENOUS

## 2020-11-30 MED ORDER — NOREPINEPHRINE 4 MG/250ML-% IV SOLN
INTRAVENOUS | Status: DC | PRN
  Administered 2020-11-30: 2 ug/min via INTRAVENOUS

## 2020-11-30 MED ORDER — SODIUM CHLORIDE 0.9% FLUSH
10.0000 mL | INTRAVENOUS | Status: DC | PRN
  Administered 2020-12-14: 10 mL

## 2020-11-30 MED ORDER — LACTATED RINGERS IV SOLN: INTRAVENOUS | Status: DC | PRN

## 2020-11-30 MED ORDER — PROPOFOL 10 MG/ML IV BOLUS
INTRAVENOUS | Status: AC
  Filled 2020-11-30: qty 20

## 2020-11-30 MED ORDER — ONDANSETRON HCL 4 MG/2ML IJ SOLN
INTRAMUSCULAR | Status: DC | PRN
  Administered 2020-11-30: 4 mg via INTRAVENOUS

## 2020-11-30 MED ORDER — PROPOFOL 10 MG/ML IV BOLUS
INTRAVENOUS | Status: DC | PRN
  Administered 2020-11-30: 200 mg via INTRAVENOUS

## 2020-11-30 MED ORDER — LIDOCAINE 2% (20 MG/ML) 5 ML SYRINGE
INTRAMUSCULAR | Status: DC | PRN
  Administered 2020-11-30: 80 mg via INTRAVENOUS

## 2020-11-30 MED ORDER — CHLORHEXIDINE GLUCONATE 0.12% ORAL RINSE (MEDLINE KIT)
15.0000 mL | Freq: Two times a day (BID) | OROMUCOSAL | Status: DC
  Administered 2020-11-30 – 2020-12-08 (×16): 15 mL via OROMUCOSAL

## 2020-11-30 MED ORDER — PROPOFOL 1000 MG/100ML IV EMUL
INTRAVENOUS | Status: AC
  Filled 2020-11-30: qty 100

## 2020-11-30 MED ORDER — MIDAZOLAM HCL 2 MG/2ML IJ SOLN
INTRAMUSCULAR | Status: AC
  Filled 2020-11-30: qty 2

## 2020-11-30 MED ORDER — FENTANYL CITRATE (PF) 250 MCG/5ML IJ SOLN
INTRAMUSCULAR | Status: DC | PRN
  Administered 2020-11-30: 50 ug via INTRAVENOUS
  Administered 2020-11-30: 150 ug via INTRAVENOUS
  Administered 2020-11-30: 50 ug via INTRAVENOUS

## 2020-11-30 MED ORDER — MIDAZOLAM 50MG/50ML (1MG/ML) PREMIX INFUSION
0.0000 mg/h | INTRAVENOUS | Status: DC
  Administered 2020-11-30: 2 mg/h via INTRAVENOUS
  Filled 2020-11-30: qty 50

## 2020-11-30 MED ORDER — MIDAZOLAM HCL 2 MG/2ML IJ SOLN
2.0000 mg | INTRAMUSCULAR | Status: DC | PRN
  Administered 2020-12-02: 2 mg via INTRAVENOUS
  Filled 2020-11-30: qty 2

## 2020-11-30 MED ORDER — LACTATED RINGERS IV SOLN: INTRAVENOUS | Status: DC

## 2020-11-30 MED ORDER — CHLORHEXIDINE GLUCONATE CLOTH 2 % EX PADS
6.0000 | MEDICATED_PAD | Freq: Every day | CUTANEOUS | Status: DC
  Administered 2020-11-30: 6 via TOPICAL

## 2020-11-30 MED ORDER — ORAL CARE MOUTH RINSE: 15.0000 mL | Freq: Once | OROMUCOSAL | Status: AC

## 2020-11-30 MED ORDER — PROPOFOL 1000 MG/100ML IV EMUL: 5.0000 ug/kg/min | INTRAVENOUS | Status: DC

## 2020-11-30 MED ORDER — PHENYLEPHRINE 40 MCG/ML (10ML) SYRINGE FOR IV PUSH (FOR BLOOD PRESSURE SUPPORT)
PREFILLED_SYRINGE | INTRAVENOUS | Status: DC | PRN
  Administered 2020-11-30 (×2): 120 ug via INTRAVENOUS
  Administered 2020-11-30: 80 ug via INTRAVENOUS

## 2020-11-30 MED ORDER — DOCUSATE SODIUM 50 MG/5ML PO LIQD
100.0000 mg | Freq: Two times a day (BID) | ORAL | Status: DC
  Administered 2020-12-01 – 2020-12-04 (×7): 100 mg
  Filled 2020-11-30 (×6): qty 10

## 2020-11-30 MED ORDER — METHOCARBAMOL 500 MG PO TABS
1000.0000 mg | ORAL_TABLET | Freq: Three times a day (TID) | ORAL | Status: DC
  Administered 2020-11-30 – 2020-12-05 (×14): 1000 mg
  Filled 2020-11-30 (×14): qty 2

## 2020-11-30 MED ORDER — ROCURONIUM BROMIDE 10 MG/ML (PF) SYRINGE
PREFILLED_SYRINGE | INTRAVENOUS | Status: DC | PRN
  Administered 2020-11-30: 50 mg via INTRAVENOUS
  Administered 2020-11-30: 100 mg via INTRAVENOUS
  Administered 2020-11-30: 50 mg via INTRAVENOUS
  Administered 2020-11-30: 100 mg via INTRAVENOUS

## 2020-11-30 MED ORDER — PROPOFOL 500 MG/50ML IV EMUL
INTRAVENOUS | Status: DC | PRN
  Administered 2020-11-30: 100 ug/kg/min via INTRAVENOUS

## 2020-11-30 MED ORDER — CHLORHEXIDINE GLUCONATE 0.12 % MT SOLN: 15.0000 mL | Freq: Once | OROMUCOSAL | Status: AC

## 2020-11-30 MED ORDER — SUCCINYLCHOLINE CHLORIDE 200 MG/10ML IV SOSY
PREFILLED_SYRINGE | INTRAVENOUS | Status: DC | PRN
  Administered 2020-11-30: 120 mg via INTRAVENOUS

## 2020-11-30 MED ORDER — MUPIROCIN 2 % EX OINT
1.0000 "application " | TOPICAL_OINTMENT | Freq: Two times a day (BID) | CUTANEOUS | Status: AC
  Administered 2020-11-30 – 2020-12-05 (×10): 1 via NASAL
  Filled 2020-11-30 (×2): qty 22

## 2020-11-30 MED ORDER — HYDROMORPHONE HCL 1 MG/ML IJ SOLN
INTRAMUSCULAR | Status: DC | PRN
  Administered 2020-11-30: .5 mg via INTRAVENOUS

## 2020-11-30 MED ORDER — POLYETHYLENE GLYCOL 3350 17 G PO PACK
17.0000 g | PACK | Freq: Every day | ORAL | Status: DC
  Administered 2020-12-01 – 2020-12-04 (×3): 17 g
  Filled 2020-11-30 (×3): qty 1

## 2020-11-30 MED ORDER — ALUM & MAG HYDROXIDE-SIMETH 200-200-20 MG/5ML PO SUSP
15.0000 mL | Freq: Four times a day (QID) | ORAL | Status: DC | PRN
Start: 2020-11-30 — End: 2020-12-05

## 2020-11-30 MED ORDER — SODIUM CHLORIDE 0.9% FLUSH
10.0000 mL | Freq: Two times a day (BID) | INTRAVENOUS | Status: DC
  Administered 2020-11-30 – 2020-12-06 (×12): 10 mL
  Administered 2020-12-07: 20 mL
  Administered 2020-12-07: 10 mL
  Administered 2020-12-08: 20 mL
  Administered 2020-12-08 – 2020-12-09 (×2): 10 mL
  Administered 2020-12-09: 20 mL
  Administered 2020-12-10 – 2020-12-15 (×10): 10 mL

## 2020-11-30 MED ORDER — ACETAMINOPHEN 160 MG/5ML PO SOLN
1000.0000 mg | Freq: Four times a day (QID) | ORAL | Status: DC
  Administered 2020-11-30 – 2020-12-05 (×14): 1000 mg
  Filled 2020-11-30 (×15): qty 40.6

## 2020-11-30 MED ORDER — IOHEXOL 9 MG/ML PO SOLN
ORAL | Status: AC
  Administered 2020-11-30: 500 mL
  Filled 2020-11-30: qty 1000

## 2020-11-30 MED ORDER — INSULIN ASPART 100 UNIT/ML IJ SOLN
0.0000 [IU] | Freq: Four times a day (QID) | INTRAMUSCULAR | Status: DC
  Administered 2020-11-30 – 2020-12-01 (×4): 1 [IU] via SUBCUTANEOUS
  Administered 2020-12-01: 2 [IU] via SUBCUTANEOUS
  Administered 2020-12-03: 1 [IU] via SUBCUTANEOUS
  Filled 2020-11-30: qty 0.09

## 2020-11-30 MED ORDER — OXYCODONE HCL 5 MG/5ML PO SOLN
5.0000 mg | ORAL | Status: DC | PRN
  Administered 2020-12-03 – 2020-12-05 (×7): 10 mg
  Filled 2020-11-30 (×7): qty 10

## 2020-11-30 MED ORDER — FENTANYL CITRATE (PF) 100 MCG/2ML IJ SOLN
50.0000 ug | INTRAMUSCULAR | Status: DC | PRN
  Administered 2020-12-01 (×2): 100 ug via INTRAVENOUS
  Administered 2020-12-02: 150 ug via INTRAVENOUS

## 2020-11-30 MED ORDER — FENTANYL 2500MCG IN NS 250ML (10MCG/ML) PREMIX INFUSION
50.0000 ug/h | INTRAVENOUS | Status: DC
  Administered 2020-11-30: 300 ug/h via INTRAVENOUS
  Administered 2020-11-30: 200 ug/h via INTRAVENOUS
  Administered 2020-12-01 (×2): 300 ug/h via INTRAVENOUS
  Administered 2020-12-02: 400 ug/h via INTRAVENOUS
  Administered 2020-12-02: 350 ug/h via INTRAVENOUS
  Administered 2020-12-02: 300 ug/h via INTRAVENOUS
  Administered 2020-12-02 – 2020-12-04 (×6): 400 ug/h via INTRAVENOUS
  Administered 2020-12-04: 300 ug/h via INTRAVENOUS
  Administered 2020-12-04 (×2): 400 ug/h via INTRAVENOUS
  Filled 2020-11-30 (×17): qty 250

## 2020-11-30 SURGICAL SUPPLY — 45 items
CANISTER SUCT 3000ML PPV (MISCELLANEOUS) ×2 IMPLANT
CANISTER WOUNDNEG PRESSURE 500 (CANNISTER) ×2 IMPLANT
CHLORAPREP W/TINT 26 (MISCELLANEOUS) IMPLANT
COVER SURGICAL LIGHT HANDLE (MISCELLANEOUS) ×2 IMPLANT
DRAPE LAPAROSCOPIC ABDOMINAL (DRAPES) ×2 IMPLANT
DRAPE UNIVERSAL (DRAPES) ×2 IMPLANT
DRAPE WARM FLUID 44X44 (DRAPES) ×2 IMPLANT
DRSG OPSITE POSTOP 4X10 (GAUZE/BANDAGES/DRESSINGS) IMPLANT
DRSG OPSITE POSTOP 4X8 (GAUZE/BANDAGES/DRESSINGS) IMPLANT
ELECT BLADE 4.0 EZ CLEAN MEGAD (MISCELLANEOUS) ×2
ELECT BLADE 6.5 EXT (BLADE) IMPLANT
ELECT CAUTERY BLADE 6.4 (BLADE) ×2 IMPLANT
ELECT REM PT RETURN 9FT ADLT (ELECTROSURGICAL) ×2
ELECTRODE BLDE 4.0 EZ CLN MEGD (MISCELLANEOUS) ×1 IMPLANT
ELECTRODE REM PT RTRN 9FT ADLT (ELECTROSURGICAL) ×1 IMPLANT
GAUZE SPONGE 4X4 12PLY STRL LF (GAUZE/BANDAGES/DRESSINGS) ×2 IMPLANT
GLOVE SURG ENC MOIS LTX SZ6.5 (GLOVE) ×2 IMPLANT
GLOVE SURG UNDER POLY LF SZ6 (GLOVE) ×2 IMPLANT
GLOVE SURG UNDER POLY LF SZ7.5 (GLOVE) ×2 IMPLANT
GOWN STRL REUS W/ TWL LRG LVL3 (GOWN DISPOSABLE) ×2 IMPLANT
GOWN STRL REUS W/TWL LRG LVL3 (GOWN DISPOSABLE) ×2
HANDLE SUCTION POOLE (INSTRUMENTS) ×1 IMPLANT
KIT BASIN OR (CUSTOM PROCEDURE TRAY) ×2 IMPLANT
KIT OSTOMY DRAINABLE 2.75 STR (WOUND CARE) ×2 IMPLANT
KIT TURNOVER KIT B (KITS) ×2 IMPLANT
LIGASURE IMPACT 36 18CM CVD LR (INSTRUMENTS) IMPLANT
NS IRRIG 1000ML POUR BTL (IV SOLUTION) ×4 IMPLANT
PACK GENERAL/GYN (CUSTOM PROCEDURE TRAY) ×2 IMPLANT
PAD ARMBOARD 7.5X6 YLW CONV (MISCELLANEOUS) ×2 IMPLANT
PENCIL SMOKE EVACUATOR (MISCELLANEOUS) IMPLANT
SPONGE ABDOMINAL VAC ABTHERA (MISCELLANEOUS) ×2 IMPLANT
SPONGE T-LAP 18X18 ~~LOC~~+RFID (SPONGE) IMPLANT
STAPLER PROXIMATE 75MM BLUE (STAPLE) ×2 IMPLANT
STAPLER VISISTAT 35W (STAPLE) ×2 IMPLANT
SUCTION POOLE HANDLE (INSTRUMENTS) ×2
SUT PDS AB 1 TP1 54 (SUTURE) IMPLANT
SUT PDS AB 1 TP1 96 (SUTURE) ×4 IMPLANT
SUT SILK 2 0 SH CR/8 (SUTURE) ×2 IMPLANT
SUT SILK 2 0 TIES 10X30 (SUTURE) IMPLANT
SUT SILK 3 0 SH CR/8 (SUTURE) ×2 IMPLANT
SUT SILK 3 0 TIES 10X30 (SUTURE) IMPLANT
SUT VIC AB 3-0 SH 18 (SUTURE) ×4 IMPLANT
TOWEL GREEN STERILE (TOWEL DISPOSABLE) ×2 IMPLANT
TRAY FOLEY MTR SLVR 16FR STAT (SET/KITS/TRAYS/PACK) ×2 IMPLANT
YANKAUER SUCT BULB TIP NO VENT (SUCTIONS) ×2 IMPLANT

## 2020-11-30 NOTE — Transfer of Care (Signed)
Immediate Anesthesia Transfer of Care Note  Patient: [facility]  Procedure(s) Performed: EXPLORATORY LAPAROTOMY,POSSIBLE BOWEL RESECTION, POSSIBLE OSTOMY (Abdomen)  Patient Location: ICU  Anesthesia Type:General  Level of Consciousness: sedated and Patient remains intubated per anesthesia plan  Airway & Oxygen Therapy: Patient remains intubated per anesthesia plan and Patient placed on Ventilator (see vital sign flow sheet for setting)  Post-op Assessment: Report given to RN and Post -op Vital signs reviewed and stable  Post vital signs: Reviewed and stable  Last Vitals:  Vitals Value Taken Time  BP    Temp    Pulse 110 11/30/20 1216  Resp 23 11/30/20 1216  SpO2 100 % 11/30/20 1216  Vitals shown include unvalidated device data.  Last Pain:  Vitals:   11/30/20 0832  TempSrc: Oral  PainSc: 6       Patients Stated Pain Goal: 2 (11/29/20 2000)  Complications: No notable events documented.

## 2020-11-30 NOTE — Progress Notes (Signed)
OT Cancellation Note  Patient Details Name: Jesse Waters MRN: [id number] DOB: 01/02/1996   Cancelled Treatment:    Reason Eval/Treat Not Completed: Medical issues which prohibited therapy (Pt transferred to 4N with tachycardia and high RR. Pt not medically appropriate for therapy. OT to follow.)  Flora Lipps, OTR/L Acute Rehabilitation Services Pager: [phone] Office: [phone]   Flora Lipps 11/30/2020, 2:40 PM

## 2020-11-30 NOTE — Anesthesia Procedure Notes (Signed)
Procedure Name: Intubation Date/Time: 11/30/2020 9:19 AM Performed by: Adria Dill, CRNA Pre-anesthesia Checklist: Patient identified, Emergency Drugs available, Suction available and Patient being monitored Patient Re-evaluated:Patient Re-evaluated prior to induction Oxygen Delivery Method: Circle system utilized Preoxygenation: Pre-oxygenation with 100% oxygen Induction Type: IV induction, Rapid sequence and Cricoid Pressure applied Laryngoscope Size: Miller and 3 Grade View: Grade I Tube type: Oral Tube size: 8.0 mm Number of attempts: 1 Airway Equipment and Method: Stylet and Oral airway Placement Confirmation: ETT inserted through vocal cords under direct vision, positive ETCO2 and breath sounds checked- equal and bilateral Secured at: 23 cm Tube secured with: Tape Dental Injury: Teeth and Oropharynx as per pre-operative assessment
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
## 2020-11-30 NOTE — Progress Notes (Signed)
Patient arrived from OR, placed on vent by RT. Belongings brought to room from 4NP - Advertising account planner, personal hygiene products, and clothing. Vital signs stable, no complications.

## 2020-11-30 NOTE — Progress Notes (Signed)
PT Cancellation Note  Patient Details Name: Jesse Waters MRN: [id number] DOB: 23-Jul-1995   Cancelled Treatment:    Reason Eval/Treat Not Completed: Medical issues which prohibited therapy;Patient at procedure or test/unavailable. HR in 130-140s and RR in 50s at rest, per RN. RN requesting hold on PT services this date. Will plan to attempt to follow-up tomorrow as appropriate.   Raymond Gurney, PT, DPT Acute Rehabilitation Services  Pager: [phone] Office: [phone]    Jewel Baize 11/30/2020, 9:24 AM

## 2020-11-30 NOTE — Anesthesia Procedure Notes (Signed)
Arterial Line Insertion Start/End6/29/2022 10:19 AM, 11/30/2020 10:19 AM Performed by: CRNA  Patient location: OR. Preanesthetic checklist: patient identified, IV checked, site marked, risks and benefits discussed, surgical consent, monitors and equipment checked, pre-op evaluation, timeout performed and anesthesia consent Left, radial was placed Catheter size: 20 Fr Hand hygiene performed  and maximum sterile barriers used   Attempts: 1 Procedure performed without using ultrasound guided technique. Following insertion, dressing applied. Post procedure assessment: normal and unchanged  Patient tolerated the procedure well with no immediate complications.

## 2020-11-30 NOTE — Progress Notes (Signed)
Peripherally Inserted Central Catheter Placement  The IV Nurse has discussed with the patient and/or persons authorized to consent for the patient, the purpose of this procedure and the potential benefits and risks involved with this procedure.  The benefits include less needle sticks, lab draws from the catheter, and the patient may be discharged home with the catheter. Risks include, but not limited to, infection, bleeding, blood clot (thrombus formation), and puncture of an artery; nerve damage and irregular heartbeat and possibility to perform a PICC exchange if needed/ordered by physician.  Alternatives to this procedure were also discussed.  Bard Power PICC patient education guide, fact sheet on infection prevention and patient information card has been provided to patient /or left at bedside.    PICC Placement Documentation  PICC Triple Lumen 11/30/20 PICC Left Basilic 52 cm 0 cm (Active)  Indication for Insertion or Continuance of Line Administration of hyperosmolar/irritating solutions (i.e. TPN, Vancomycin, etc.) 11/30/20 1600  Exposed Catheter (cm) 0 cm 11/30/20 1600  Site Assessment Clean;Dry;Intact 11/30/20 1600  Lumen #1 Status Flushed;Blood return noted;Saline locked 11/30/20 1600  Lumen #2 Status Flushed;Blood return noted;Saline locked 11/30/20 1600  Lumen #3 Status Flushed;Blood return noted;Saline locked 11/30/20 1600  Dressing Type Transparent 11/30/20 1600  Dressing Status Clean;Intact;Dry 11/30/20 1600  Antimicrobial disc in place? Yes 11/30/20 1600  Dressing Change Due 12/07/20 11/30/20 1600       Audrie Gallus 11/30/2020, 4:15 PM
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
## 2020-11-30 NOTE — Progress Notes (Signed)
MRSA nasal swab tested positive. Standing orders placed at this time. Family notified at bedside.

## 2020-11-30 NOTE — Consult Note (Signed)
Consult received for colostomy and vac change. Will order supplies and complete first ostomy and vac change on Monday 12/05/20  Chip Boer L. Katrinka Blazing, MSN, RN, CMSRN, Angus Seller, [facility] Wound Treatment Associate Pager [phone]

## 2020-11-30 NOTE — Progress Notes (Signed)
Due to severe psoriasis - family requested that we do not use CHG cloths on patient and we use a special soap they brought from home. It is at bedside. Will update chart accordingly.
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
## 2020-11-30 NOTE — Op Note (Signed)
   Operative Note   Date: 11/30/2020  Procedure: re-exploration laparotomy, abdominal washout, adhesiolysis x1h, repair of small bowel serosal injury, creation of colostomy, abthera negative pressure wound vac application  Pre-op diagnosis: anastomotic leak Post-op diagnosis: same  Indication and clinical history: The patient is a 25 y.o. year old male with anastomotic leak after colostomy takedown.     Surgeon: Diamantina Monks, MD Assistant: Corliss Skains, MD  Anesthesiologist: Tacy Dura, MD Anesthesia: General  Findings:  Specimen: none EBL: 50cc Drains/Implants: none  Disposition: ICU - intubated and critically ill.  Description of procedure: The patient was positioned supine on the operating room table. General anesthetic induction and intubation were uneventful. Foley catheter insertion was performed and was atraumatic. Time-out was performed verifying correct patient, procedure, signature of informed consent, and administration of pre-operative antibiotics. The abdomen was prepped and draped in the usual sterile fashion after removal of the skin staples.  The fascial sutures were removed and feculent fluid encountered and suctioned. The anastomosis appeared mostly intact with a punctate region of perforation. Adhesiolysis for approximately one hour was performed to mobilize the colon for colostomy re-creation. DUring adhesiolysis a serosal injury of the small bowel was created and repaired using interrupted 2-0 silk sutures. Lateral adhesions were too dense, so the transverse colon was mobilized and transected using a GIA stapler. A new colostomy site was created in the left upper quadrant and the transected end of colon brought through the colostomy site. The abdomen was copiously irrigated and the bowel was too distended to close the fascia, so an abthera wound vac was placed in the abdomen. Good seal was obtained. The ostomy was then matured.   All sponge and instrument counts were  correct at the conclusion of the procedure. The patient was awakened from anesthesia, extubated uneventfully, and transported to the PACU in good condition. There were no complications.   Upon entering the abdomen (organ space), I encountered feculent peritonitis.  CASE DATA:  Type of patient?: Elective [facility] Private Case  Status of Case? URGENT Add On  Infection Present At Time Of Surgery (PATOS)?  FECULENT PERITONITIS   Clinical update provided to patient's sister and mother.  Diamantina Monks, MD General and Trauma Surgery [facility]

## 2020-11-30 NOTE — Anesthesia Postprocedure Evaluation (Signed)
Anesthesia Post Note  Patient: Interior and spatial designer  Procedure(s) Performed: EXPLORATORY LAPAROTOMY,POSSIBLE BOWEL RESECTION, POSSIBLE OSTOMY (Abdomen)     Patient location during evaluation: PACU Anesthesia Type: General Level of consciousness: awake and alert Pain management: pain level controlled Vital Signs Assessment: post-procedure vital signs reviewed and stable Respiratory status: spontaneous breathing, nonlabored ventilation, respiratory function stable and patient connected to nasal cannula oxygen Cardiovascular status: blood pressure returned to baseline and stable Postop Assessment: no apparent nausea or vomiting Anesthetic complications: no   No notable events documented.  Last Vitals:  Vitals:   11/30/20 1500 11/30/20 1538  BP: 112/60 112/60  Pulse: (!) 101 (!) 122  Resp: 20 20  Temp: (!) 38.5 C (!) 38.8 C  SpO2: 100% 98%    Last Pain:  Vitals:   11/30/20 1217  TempSrc: Esophageal  PainSc:                  Orian Figueira

## 2020-11-30 NOTE — Progress Notes (Addendum)
Per orders in [facility], patient was supposed to be given precedex and versed gtt post op. I called Lovick, MD due to patient's SBP being >230 after giving PRN lopressor. Verbal order to put in propofol and fentanyl and was told precedex should have never been ordered on patient. I confirmed with Lovick that there was no fentanyl gtt ordered - only PRN pushes and precedex had been ordered under her name. Unsure what happened. Pharmacy fixed orders and I will STAT change the gtts over to propofol and fentanyl.    I also asked about patients esophageal temp being >102 degrees F. Lovick verbally stated to not give more tylenol at this time and that 102 is ok.   Will continue to monitor.     1337 update: Patient's BP now 143/61 & HR 102 with propofol and fentanyl on board. Family at bedside and all questions answered.

## 2020-11-30 NOTE — Progress Notes (Signed)
Pt to go to OR. PCA dilaudid stopped, 20mg  Dilaudid wasted with RN, Melissa in .
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
## 2020-12-01 ENCOUNTER — Encounter (HOSPITAL_COMMUNITY): Payer: Self-pay | Admitting: Surgery

## 2020-12-01 LAB — MAGNESIUM: Magnesium: 3.4 mg/dL — ABNORMAL HIGH (ref 1.7–2.4)

## 2020-12-01 LAB — COMPREHENSIVE METABOLIC PANEL
ALT: 25 U/L (ref 0–44)
AST: 30 U/L (ref 15–41)
Albumin: 2 g/dL — ABNORMAL LOW (ref 3.5–5.0)
Alkaline Phosphatase: 30 U/L — ABNORMAL LOW (ref 38–126)
Anion gap: 9 (ref 5–15)
BUN: 55 mg/dL — ABNORMAL HIGH (ref 6–20)
CO2: 27 mmol/L (ref 22–32)
Calcium: 7.9 mg/dL — ABNORMAL LOW (ref 8.9–10.3)
Chloride: 103 mmol/L (ref 98–111)
Creatinine, Ser: 1.82 mg/dL — ABNORMAL HIGH (ref 0.61–1.24)
GFR, Estimated: 52 mL/min — ABNORMAL LOW (ref >60)
Glucose, Bld: 142 mg/dL — ABNORMAL HIGH (ref 70–99)
Potassium: 3.8 mmol/L (ref 3.5–5.1)
Sodium: 139 mmol/L (ref 135–145)
Total Bilirubin: 0.7 mg/dL (ref 0.3–1.2)
Total Protein: 5.3 g/dL — ABNORMAL LOW (ref 6.5–8.1)

## 2020-12-01 LAB — CBC
HCT: 25 % — ABNORMAL LOW (ref 39.0–52.0)
Hemoglobin: 8.2 g/dL — ABNORMAL LOW (ref 13.0–17.0)
MCH: 28.2 pg (ref 26.0–34.0)
MCHC: 32.8 g/dL (ref 30.0–36.0)
MCV: 85.9 fL (ref 80.0–100.0)
Platelets: 315 10*3/uL (ref 150–400)
RBC: 2.91 MIL/uL — ABNORMAL LOW (ref 4.22–5.81)
RDW: 15.9 % — ABNORMAL HIGH (ref 11.5–15.5)
WBC: 12 10*3/uL — ABNORMAL HIGH (ref 4.0–10.5)
nRBC: 0.2 % (ref 0.0–0.2)

## 2020-12-01 LAB — BPAM RBC
Blood Product Expiration Date: 202208012359
Blood Product Expiration Date: 202208012359
ISSUE DATE / TIME: 202206291026
ISSUE DATE / TIME: 202206291026
Unit Type and Rh: 5100
Unit Type and Rh: 5100

## 2020-12-01 LAB — BASIC METABOLIC PANEL
Anion gap: 8 (ref 5–15)
BUN: 46 mg/dL — ABNORMAL HIGH (ref 6–20)
CO2: 28 mmol/L (ref 22–32)
Calcium: 7.7 mg/dL — ABNORMAL LOW (ref 8.9–10.3)
Chloride: 104 mmol/L (ref 98–111)
Creatinine, Ser: 1.54 mg/dL — ABNORMAL HIGH (ref 0.61–1.24)
GFR, Estimated: >60 mL/min (ref >60)
Glucose, Bld: 120 mg/dL — ABNORMAL HIGH (ref 70–99)
Potassium: 3.5 mmol/L (ref 3.5–5.1)
Sodium: 140 mmol/L (ref 135–145)

## 2020-12-01 LAB — TYPE AND SCREEN
ABO/RH(D): O POS
Antibody Screen: NEGATIVE
Unit division: 0
Unit division: 0

## 2020-12-01 LAB — GLUCOSE, CAPILLARY
Glucose-Capillary: 151 mg/dL — ABNORMAL HIGH (ref 70–99)
Glucose-Capillary: 143 mg/dL — ABNORMAL HIGH (ref 70–99)
Glucose-Capillary: 142 mg/dL — ABNORMAL HIGH (ref 70–99)
Glucose-Capillary: 117 mg/dL — ABNORMAL HIGH (ref 70–99)

## 2020-12-01 LAB — TRIGLYCERIDES: Triglycerides: 377 mg/dL — ABNORMAL HIGH (ref <150)

## 2020-12-01 LAB — PHOSPHORUS: Phosphorus: 4.8 mg/dL — ABNORMAL HIGH (ref 2.5–4.6)

## 2020-12-01 MED ORDER — TRAVASOL 10 % IV SOLN
INTRAVENOUS | Status: AC
  Filled 2020-12-01: qty 806.4

## 2020-12-01 MED ORDER — LACTATED RINGERS IV SOLN: INTRAVENOUS | Status: AC
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
## 2020-12-01 NOTE — Progress Notes (Signed)
Patient's urine output from 2000- 2200 was a total of 65 ml and patient's blood pressure was rising above 200 SBP. Urine was noted to have sediment the previous night so a 10 ml flush was hooked up to the port to flush. Catheter seemed to be obstructed and met resistance when attempting flush. Bladder scan showed 500 ml of urine in the bladder so foley was removed. When the foley was removed, the tip of the catheter was noted to be crystallized and clogged. The patient's BP was still elevated above 200 SBP, so an In and out cath was performed and 700 mL of amber urine was drained. Urine had a few blood clots and clumps of sediment. Patient's SBP is trending down post cath into the 150s SBP. Will continue to monitor.
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
## 2020-12-02 ENCOUNTER — Inpatient Hospital Stay (HOSPITAL_COMMUNITY): Payer: No Typology Code available for payment source | Admitting: Anesthesiology

## 2020-12-02 ENCOUNTER — Encounter (HOSPITAL_COMMUNITY): Admission: RE | Disposition: A | Discharge status: Home / Self Care | Payer: Self-pay | Source: Home / Self Care

## 2020-12-02 HISTORY — PX: APPLICATION OF WOUND VAC: SHX5189

## 2020-12-02 HISTORY — PX: WOUND DEBRIDEMENT: SHX247

## 2020-12-02 LAB — BASIC METABOLIC PANEL
Anion gap: 9 (ref 5–15)
BUN: 40 mg/dL — ABNORMAL HIGH (ref 6–20)
CO2: 28 mmol/L (ref 22–32)
Calcium: 8.1 mg/dL — ABNORMAL LOW (ref 8.9–10.3)
Chloride: 105 mmol/L (ref 98–111)
Creatinine, Ser: 1.31 mg/dL — ABNORMAL HIGH (ref 0.61–1.24)
GFR, Estimated: >60 mL/min (ref >60)
Glucose, Bld: 117 mg/dL — ABNORMAL HIGH (ref 70–99)
Potassium: 3.4 mmol/L — ABNORMAL LOW (ref 3.5–5.1)
Sodium: 142 mmol/L (ref 135–145)

## 2020-12-02 LAB — POCT I-STAT 7, (LYTES, BLD GAS, ICA,H+H)
Acid-Base Excess: 4 mmol/L — ABNORMAL HIGH (ref 0.0–2.0)
Bicarbonate: 32.1 mmol/L — ABNORMAL HIGH (ref 20.0–28.0)
Calcium, Ion: 1.06 mmol/L — ABNORMAL LOW (ref 1.15–1.40)
HCT: 26 % — ABNORMAL LOW (ref 39.0–52.0)
Hemoglobin: 8.8 g/dL — ABNORMAL LOW (ref 13.0–17.0)
O2 Saturation: 99 %
Patient temperature: 37.6
Potassium: 4.3 mmol/L (ref 3.5–5.1)
Sodium: 142 mmol/L (ref 135–145)
TCO2: 34 mmol/L — ABNORMAL HIGH (ref 22–32)
pCO2 arterial: 68.8 mmHg (ref 32.0–48.0)
pH, Arterial: 7.28 — ABNORMAL LOW (ref 7.350–7.450)
pO2, Arterial: 141 mmHg — ABNORMAL HIGH (ref 83.0–108.0)

## 2020-12-02 LAB — MAGNESIUM: Magnesium: 3 mg/dL — ABNORMAL HIGH (ref 1.7–2.4)

## 2020-12-02 LAB — PHOSPHORUS: Phosphorus: 4.3 mg/dL (ref 2.5–4.6)

## 2020-12-02 LAB — CBC
HCT: 28.1 % — ABNORMAL LOW (ref 39.0–52.0)
Hemoglobin: 8.9 g/dL — ABNORMAL LOW (ref 13.0–17.0)
MCH: 27.8 pg (ref 26.0–34.0)
MCHC: 31.7 g/dL (ref 30.0–36.0)
MCV: 87.8 fL (ref 80.0–100.0)
Platelets: 397 10*3/uL (ref 150–400)
RBC: 3.2 MIL/uL — ABNORMAL LOW (ref 4.22–5.81)
RDW: 16.6 % — ABNORMAL HIGH (ref 11.5–15.5)
WBC: 16.3 10*3/uL — ABNORMAL HIGH (ref 4.0–10.5)
nRBC: 0 % (ref 0.0–0.2)

## 2020-12-02 LAB — GLUCOSE, CAPILLARY
Glucose-Capillary: 112 mg/dL — ABNORMAL HIGH (ref 70–99)
Glucose-Capillary: 112 mg/dL — ABNORMAL HIGH (ref 70–99)
Glucose-Capillary: 91 mg/dL (ref 70–99)
Glucose-Capillary: 105 mg/dL — ABNORMAL HIGH (ref 70–99)

## 2020-12-02 LAB — TRIGLYCERIDES: Triglycerides: 535 mg/dL — ABNORMAL HIGH (ref <150)

## 2020-12-02 SURGERY — IRRIGATION AND DEBRIDEMENT, WOUND, ABDOMEN, WITH CLOSURE
Anesthesia: General | Site: Abdomen

## 2020-12-02 MED ORDER — MIDAZOLAM HCL 2 MG/2ML IJ SOLN
INTRAMUSCULAR | Status: AC
  Filled 2020-12-02: qty 2

## 2020-12-02 MED ORDER — LACTATED RINGERS IV SOLN: INTRAVENOUS | Status: DC | PRN

## 2020-12-02 MED ORDER — ALBUMIN HUMAN 5 % IV SOLN: INTRAVENOUS | Status: DC | PRN

## 2020-12-02 MED ORDER — FENTANYL CITRATE (PF) 250 MCG/5ML IJ SOLN
INTRAMUSCULAR | Status: DC | PRN
  Administered 2020-12-02: 100 ug via INTRAVENOUS
  Administered 2020-12-02: 150 ug via INTRAVENOUS

## 2020-12-02 MED ORDER — FENTANYL CITRATE (PF) 250 MCG/5ML IJ SOLN
INTRAMUSCULAR | Status: AC
  Filled 2020-12-02: qty 5

## 2020-12-02 MED ORDER — POTASSIUM CHLORIDE 10 MEQ/100ML IV SOLN
10.0000 meq | INTRAVENOUS | Status: DC
  Filled 2020-12-02: qty 100

## 2020-12-02 MED ORDER — 0.9 % SODIUM CHLORIDE (POUR BTL) OPTIME
TOPICAL | Status: DC | PRN
  Administered 2020-12-02: 7000 mL
  Administered 2020-12-02: 1000 mL

## 2020-12-02 MED ORDER — PROPOFOL 1000 MG/100ML IV EMUL
INTRAVENOUS | Status: AC
  Filled 2020-12-02: qty 100

## 2020-12-02 MED ORDER — PROPOFOL 10 MG/ML IV BOLUS
INTRAVENOUS | Status: AC
  Filled 2020-12-02: qty 20

## 2020-12-02 MED ORDER — POTASSIUM CHLORIDE 10 MEQ/100ML IV SOLN
10.0000 meq | INTRAVENOUS | Status: AC
  Administered 2020-12-02 (×8): 10 meq via INTRAVENOUS
  Filled 2020-12-02 (×5): qty 100

## 2020-12-02 MED ORDER — MIDAZOLAM HCL 2 MG/2ML IJ SOLN
INTRAMUSCULAR | Status: DC | PRN
  Administered 2020-12-02: 2 mg via INTRAVENOUS

## 2020-12-02 MED ORDER — ROCURONIUM BROMIDE 10 MG/ML (PF) SYRINGE
PREFILLED_SYRINGE | INTRAVENOUS | Status: AC
  Filled 2020-12-02: qty 10

## 2020-12-02 MED ORDER — METOPROLOL TARTRATE 25 MG/10 ML ORAL SUSPENSION
25.0000 mg | Freq: Two times a day (BID) | ORAL | Status: DC
  Administered 2020-12-02 – 2020-12-03 (×3): 25 mg
  Filled 2020-12-02 (×3): qty 10

## 2020-12-02 MED ORDER — CHROMIC CHLORIDE 40 MCG/10ML IV SOLN
INTRAVENOUS | Status: AC
  Filled 2020-12-02: qty 832

## 2020-12-02 MED ORDER — HYDRALAZINE HCL 20 MG/ML IJ SOLN
10.0000 mg | Freq: Once | INTRAMUSCULAR | Status: AC
  Administered 2020-12-02: 10 mg via INTRAVENOUS
  Filled 2020-12-02: qty 1

## 2020-12-02 MED ORDER — SODIUM CHLORIDE 0.9 % IV SOLN
INTRAVENOUS | Status: DC | PRN
  Administered 2020-12-02 – 2020-12-04 (×2): 500 mL via INTRAVENOUS
  Administered 2020-12-09: 250 mL via INTRAVENOUS

## 2020-12-02 MED ORDER — ROCURONIUM BROMIDE 10 MG/ML (PF) SYRINGE
PREFILLED_SYRINGE | INTRAVENOUS | Status: DC | PRN
  Administered 2020-12-02: 100 mg via INTRAVENOUS
  Administered 2020-12-02 (×2): 50 mg via INTRAVENOUS

## 2020-12-02 SURGICAL SUPPLY — 37 items
BAG COUNTER SPONGE SURGICOUNT (BAG) ×3 IMPLANT
BENZOIN TINCTURE PRP APPL 2/3 (GAUZE/BANDAGES/DRESSINGS) ×3 IMPLANT
BNDG GAUZE ELAST 4 BULKY (GAUZE/BANDAGES/DRESSINGS) IMPLANT
CANISTER SUCT 3000ML PPV (MISCELLANEOUS) ×3 IMPLANT
CANISTER WOUNDNEG PRESSURE 500 (CANNISTER) ×3 IMPLANT
COVER SURGICAL LIGHT HANDLE (MISCELLANEOUS) ×3 IMPLANT
DRAIN CHANNEL 19F RND (DRAIN) ×3 IMPLANT
DRAIN JACKSON PRATT 1/4 1325 (MISCELLANEOUS) ×3 IMPLANT
DRAPE LAPAROSCOPIC ABDOMINAL (DRAPES) ×3 IMPLANT
DRAPE LAPAROTOMY 100X72 PEDS (DRAPES) IMPLANT
DRAPE WARM FLUID 44X44 (DRAPES) ×3 IMPLANT
DRSG PAD ABDOMINAL 8X10 ST (GAUZE/BANDAGES/DRESSINGS) IMPLANT
DRSG VAC ATS LRG SENSATRAC (GAUZE/BANDAGES/DRESSINGS) ×3 IMPLANT
ELECT REM PT RETURN 9FT ADLT (ELECTROSURGICAL) ×3
ELECTRODE REM PT RTRN 9FT ADLT (ELECTROSURGICAL) ×2 IMPLANT
GAUZE SPONGE 4X4 12PLY STRL (GAUZE/BANDAGES/DRESSINGS) IMPLANT
GLOVE SURG ENC MOIS LTX SZ6.5 (GLOVE) ×3 IMPLANT
GLOVE SURG UNDER POLY LF SZ6 (GLOVE) ×3 IMPLANT
GOWN STRL REUS W/ TWL LRG LVL3 (GOWN DISPOSABLE) ×4 IMPLANT
GOWN STRL REUS W/TWL LRG LVL3 (GOWN DISPOSABLE) ×2
HANDLE SUCTION POOLE (INSTRUMENTS) ×2 IMPLANT
KIT OSTOMY DRAINABLE 2.75 STR (WOUND CARE) ×3 IMPLANT
KIT TURNOVER KIT B (KITS) ×3 IMPLANT
NS IRRIG 1000ML POUR BTL (IV SOLUTION) ×3 IMPLANT
PACK GENERAL/GYN (CUSTOM PROCEDURE TRAY) ×3 IMPLANT
PAD ARMBOARD 7.5X6 YLW CONV (MISCELLANEOUS) ×6 IMPLANT
PENCIL SMOKE EVACUATOR (MISCELLANEOUS) ×3 IMPLANT
RETAINER VISCERA MED (MISCELLANEOUS) ×3 IMPLANT
STAPLER VISISTAT 35W (STAPLE) ×6 IMPLANT
SUCTION POOLE HANDLE (INSTRUMENTS) ×3
SUT ETHILON 3 0 FSL (SUTURE) ×3 IMPLANT
SUT PDS AB 1 CTX 36 (SUTURE) ×6 IMPLANT
SUT PDS AB 1 TP1 54 (SUTURE) ×12 IMPLANT
SWAB COLLECTION DEVICE MRSA (MISCELLANEOUS) IMPLANT
SWAB CULTURE ESWAB REG 1ML (MISCELLANEOUS) IMPLANT
TOWEL GREEN STERILE (TOWEL DISPOSABLE) ×3 IMPLANT
TOWEL GREEN STERILE FF (TOWEL DISPOSABLE) ×3 IMPLANT
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
## 2020-12-02 NOTE — Progress Notes (Signed)
   12/02/20 0230  Vitals  Temp (!) 101.3 F (38.5 C)  Pulse Rate (!) 101  ECG Heart Rate (!) 101  Resp 20  Oxygen Therapy  SpO2 100 %  Art Line  Arterial Line BP 199/80  Arterial Line MAP (mmHg) 111 mmHg  MEWS Score  MEWS Temp 1  MEWS Systolic 0  MEWS Pulse 1  MEWS RR 0  MEWS LOC 1  MEWS Score 3  MEWS Score Color Yellow  Provider Notification  Provider Name/Title Dr. Janee Morn  Date Provider Notified 12/02/20  Time Provider Notified [phone]  Notification Type Call  Notification Reason Change in status (SBP 190s, temp elevated)  Provider response See new orders (One time dose Hydralazine, cooling blanket)  Date of Provider Response 12/02/20  Time of Provider Response [id number]

## 2020-12-02 NOTE — Anesthesia Postprocedure Evaluation (Signed)
Anesthesia Post Note  Patient: Jesse Waters  Procedure(s) Performed: DEBRIDEMENT OF ABDOMINAL WOUND (Abdomen) APPLICATION OF WOUND VAC (Abdomen)     Patient location during evaluation: SICU Anesthesia Type: General Level of consciousness: sedated Pain management: pain level controlled Vital Signs Assessment: post-procedure vital signs reviewed and stable Respiratory status: patient remains intubated per anesthesia plan Cardiovascular status: stable Postop Assessment: no apparent nausea or vomiting Anesthetic complications: no   No notable events documented.  Last Vitals:  Vitals:   12/02/20 1500 12/02/20 1523  BP: 137/71 (!) 142/73  Pulse: (!) 104 (!) 106  Resp: 20 20  Temp: (!) 38.3 C   SpO2: 99% 98%    Last Pain:  Vitals:   12/02/20 0400  TempSrc: Esophageal  PainSc:                  Micharl Helmes S

## 2020-12-02 NOTE — Transfer of Care (Signed)
Immediate Anesthesia Transfer of Care Note  Patient: Jesse Waters  Procedure(s) Performed: DEBRIDEMENT OF ABDOMINAL WOUND (Abdomen) APPLICATION OF WOUND VAC (Abdomen)  Patient Location: ICU  Anesthesia Type:General  Level of Consciousness: Patient remains intubated per anesthesia plan  Airway & Oxygen Therapy: Patient remains intubated per anesthesia plan and Patient placed on Ventilator (see vital sign flow sheet for setting)  Post-op Assessment: Report given to RN and Post -op Vital signs reviewed and stable  Post vital signs: Reviewed and stable  Last Vitals:  Vitals Value Taken Time  BP    Temp    Pulse    Resp    SpO2      Last Pain:  Vitals:   12/02/20 0400  TempSrc: Esophageal  PainSc:       Patients Stated Pain Goal: 2 (11/29/20 2000)  Complications: No notable events documented.
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
## 2020-12-02 NOTE — Progress Notes (Signed)
It seems like any time the patient's bladder contains any urine over 200 mL, patient's BP trends up to 190s-200s. This RN has I&O cathed twice, 4 hours apart. Dr. Janee Morn paged about placing a foley back, and reminded that the previous foley was not flowing and when removed the tip was blocked with dark amber crystallized urine. New verbal to place foley catheter back to prevent BP from shooting up when urine starts filling the bladder.

## 2020-12-02 NOTE — Anesthesia Procedure Notes (Signed)
Date/Time: 12/02/2020 9:57 AM Performed by: Jodell Cipro, CRNA Pre-anesthesia Checklist: Patient identified, Emergency Drugs available, Suction available and Patient being monitored Patient Re-evaluated:Patient Re-evaluated prior to induction Induction Type: Inhalational induction with existing ETT Placement Confirmation: positive ETCO2 Dental Injury: Teeth and Oropharynx as per pre-operative assessment

## 2020-12-03 ENCOUNTER — Inpatient Hospital Stay (HOSPITAL_COMMUNITY): Payer: No Typology Code available for payment source

## 2020-12-03 LAB — BASIC METABOLIC PANEL
Anion gap: 7 (ref 5–15)
BUN: 30 mg/dL — ABNORMAL HIGH (ref 6–20)
CO2: 27 mmol/L (ref 22–32)
Calcium: 7.7 mg/dL — ABNORMAL LOW (ref 8.9–10.3)
Chloride: 107 mmol/L (ref 98–111)
Creatinine, Ser: 1.21 mg/dL (ref 0.61–1.24)
GFR, Estimated: >60 mL/min (ref >60)
Glucose, Bld: 146 mg/dL — ABNORMAL HIGH (ref 70–99)
Potassium: 4 mmol/L (ref 3.5–5.1)
Sodium: 141 mmol/L (ref 135–145)

## 2020-12-03 LAB — GLUCOSE, CAPILLARY
Glucose-Capillary: 128 mg/dL — ABNORMAL HIGH (ref 70–99)
Glucose-Capillary: 136 mg/dL — ABNORMAL HIGH (ref 70–99)
Glucose-Capillary: 136 mg/dL — ABNORMAL HIGH (ref 70–99)
Glucose-Capillary: 163 mg/dL — ABNORMAL HIGH (ref 70–99)
Glucose-Capillary: 180 mg/dL — ABNORMAL HIGH (ref 70–99)

## 2020-12-03 LAB — CBC
HCT: 29.4 % — ABNORMAL LOW (ref 39.0–52.0)
Hemoglobin: 9.2 g/dL — ABNORMAL LOW (ref 13.0–17.0)
MCH: 28 pg (ref 26.0–34.0)
MCHC: 31.3 g/dL (ref 30.0–36.0)
MCV: 89.6 fL (ref 80.0–100.0)
Platelets: 433 10*3/uL — ABNORMAL HIGH (ref 150–400)
RBC: 3.28 MIL/uL — ABNORMAL LOW (ref 4.22–5.81)
RDW: 17.2 % — ABNORMAL HIGH (ref 11.5–15.5)
WBC: 21.1 10*3/uL — ABNORMAL HIGH (ref 4.0–10.5)
nRBC: 0 % (ref 0.0–0.2)

## 2020-12-03 LAB — PHOSPHORUS: Phosphorus: 5.5 mg/dL — ABNORMAL HIGH (ref 2.5–4.6)

## 2020-12-03 LAB — MAGNESIUM: Magnesium: 2.9 mg/dL — ABNORMAL HIGH (ref 1.7–2.4)

## 2020-12-03 MED ORDER — INSULIN ASPART 100 UNIT/ML IJ SOLN
0.0000 [IU] | INTRAMUSCULAR | Status: DC
  Administered 2020-12-03 (×2): 2 [IU] via SUBCUTANEOUS
  Administered 2020-12-04: 3 [IU] via SUBCUTANEOUS

## 2020-12-03 MED ORDER — HYDRALAZINE HCL 20 MG/ML IJ SOLN
10.0000 mg | Freq: Four times a day (QID) | INTRAMUSCULAR | Status: DC | PRN
  Administered 2020-12-03 – 2020-12-09 (×6): 10 mg via INTRAVENOUS
  Filled 2020-12-03 (×6): qty 1

## 2020-12-03 MED ORDER — TRACE MINERALS CU-MN-SE-ZN 300-55-60-3000 MCG/ML IV SOLN
INTRAVENOUS | Status: AC
  Filled 2020-12-03: qty 1139.2

## 2020-12-03 MED ORDER — ENALAPRILAT 1.25 MG/ML IV SOLN
1.2500 mg | Freq: Four times a day (QID) | INTRAVENOUS | Status: DC | PRN
  Administered 2020-12-03 – 2020-12-10 (×4): 1.25 mg via INTRAVENOUS
  Filled 2020-12-03 (×5): qty 1
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
## 2020-12-04 LAB — PHOSPHORUS: Phosphorus: 2.6 mg/dL (ref 2.5–4.6)

## 2020-12-04 LAB — GLUCOSE, CAPILLARY
Glucose-Capillary: 137 mg/dL — ABNORMAL HIGH (ref 70–99)
Glucose-Capillary: 184 mg/dL — ABNORMAL HIGH (ref 70–99)
Glucose-Capillary: 120 mg/dL — ABNORMAL HIGH (ref 70–99)
Glucose-Capillary: 125 mg/dL — ABNORMAL HIGH (ref 70–99)
Glucose-Capillary: 133 mg/dL — ABNORMAL HIGH (ref 70–99)

## 2020-12-04 LAB — MAGNESIUM: Magnesium: 2.7 mg/dL — ABNORMAL HIGH (ref 1.7–2.4)

## 2020-12-04 LAB — CBC
HCT: 28.3 % — ABNORMAL LOW (ref 39.0–52.0)
Hemoglobin: 8.7 g/dL — ABNORMAL LOW (ref 13.0–17.0)
MCH: 27.8 pg (ref 26.0–34.0)
MCHC: 30.7 g/dL (ref 30.0–36.0)
MCV: 90.4 fL (ref 80.0–100.0)
Platelets: 489 10*3/uL — ABNORMAL HIGH (ref 150–400)
RBC: 3.13 MIL/uL — ABNORMAL LOW (ref 4.22–5.81)
RDW: 17.5 % — ABNORMAL HIGH (ref 11.5–15.5)
WBC: 24.1 10*3/uL — ABNORMAL HIGH (ref 4.0–10.5)
nRBC: 0.2 % (ref 0.0–0.2)

## 2020-12-04 LAB — AEROBIC/ANAEROBIC CULTURE W GRAM STAIN (SURGICAL/DEEP WOUND)

## 2020-12-04 LAB — BASIC METABOLIC PANEL
Anion gap: 6 (ref 5–15)
BUN: 25 mg/dL — ABNORMAL HIGH (ref 6–20)
CO2: 27 mmol/L (ref 22–32)
Calcium: 7.9 mg/dL — ABNORMAL LOW (ref 8.9–10.3)
Chloride: 110 mmol/L (ref 98–111)
Creatinine, Ser: 0.99 mg/dL (ref 0.61–1.24)
GFR, Estimated: >60 mL/min (ref >60)
Glucose, Bld: 149 mg/dL — ABNORMAL HIGH (ref 70–99)
Potassium: 4.2 mmol/L (ref 3.5–5.1)
Sodium: 143 mmol/L (ref 135–145)

## 2020-12-04 LAB — TRIGLYCERIDES: Triglycerides: 559 mg/dL — ABNORMAL HIGH (ref <150)

## 2020-12-04 MED ORDER — INSULIN ASPART 100 UNIT/ML IJ SOLN
0.0000 [IU] | INTRAMUSCULAR | Status: AC
  Administered 2020-12-04: 3 [IU] via SUBCUTANEOUS
  Administered 2020-12-04: 2 [IU] via SUBCUTANEOUS

## 2020-12-04 MED ORDER — INSULIN ASPART 100 UNIT/ML IJ SOLN
0.0000 [IU] | INTRAMUSCULAR | Status: DC
  Administered 2020-12-04 – 2020-12-05 (×3): 3 [IU] via SUBCUTANEOUS

## 2020-12-04 MED ORDER — LACTATED RINGERS IV SOLN: INTRAVENOUS | Status: DC

## 2020-12-04 MED ORDER — METOPROLOL TARTRATE 5 MG/5ML IV SOLN
5.0000 mg | Freq: Four times a day (QID) | INTRAVENOUS | Status: DC
  Administered 2020-12-04 – 2020-12-15 (×40): 5 mg via INTRAVENOUS
  Filled 2020-12-04 (×42): qty 5

## 2020-12-04 MED ORDER — HYDROMORPHONE HCL 1 MG/ML IJ SOLN
0.5000 mg | INTRAMUSCULAR | Status: AC
  Administered 2020-12-04 – 2020-12-05 (×7): 0.5 mg via INTRAVENOUS
  Filled 2020-12-04 (×10): qty 1

## 2020-12-04 MED ORDER — FUROSEMIDE 10 MG/ML IJ SOLN
20.0000 mg | Freq: Once | INTRAMUSCULAR | Status: AC
  Administered 2020-12-04: 20 mg via INTRAVENOUS
  Filled 2020-12-04: qty 2

## 2020-12-04 MED ORDER — TRACE MINERALS CU-MN-SE-ZN 300-55-60-3000 MCG/ML IV SOLN
INTRAVENOUS | Status: AC
  Filled 2020-12-04: qty 1137.6

## 2020-12-04 MED ORDER — POTASSIUM & SODIUM PHOSPHATES 280-160-250 MG PO PACK
2.0000 | PACK | Freq: Once | ORAL | Status: AC
  Administered 2020-12-04: 2
  Filled 2020-12-04: qty 2

## 2020-12-04 MED ORDER — LINEZOLID 600 MG/300ML IV SOLN
600.0000 mg | Freq: Two times a day (BID) | INTRAVENOUS | Status: DC
  Administered 2020-12-04 – 2020-12-08 (×9): 600 mg via INTRAVENOUS
  Filled 2020-12-04 (×10): qty 300
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
## 2020-12-05 LAB — DIFFERENTIAL
Abs Immature Granulocytes: 1.4 10*3/uL — ABNORMAL HIGH (ref 0.00–0.07)
Basophils Absolute: 0 10*3/uL (ref 0.0–0.1)
Basophils Relative: 0 %
Eosinophils Absolute: 0.5 10*3/uL (ref 0.0–0.5)
Eosinophils Relative: 3 %
Lymphocytes Relative: 15 %
Lymphs Abs: 2.7 10*3/uL (ref 0.7–4.0)
Metamyelocytes Relative: 2 %
Monocytes Absolute: 0.7 10*3/uL (ref 0.1–1.0)
Monocytes Relative: 4 %
Myelocytes: 6 %
Neutro Abs: 12.5 10*3/uL — ABNORMAL HIGH (ref 1.7–7.7)
Neutrophils Relative %: 70 %
nRBC: 0 /100{WBCs}

## 2020-12-05 LAB — GLUCOSE, CAPILLARY
Glucose-Capillary: 135 mg/dL — ABNORMAL HIGH (ref 70–99)
Glucose-Capillary: 143 mg/dL — ABNORMAL HIGH (ref 70–99)
Glucose-Capillary: 166 mg/dL — ABNORMAL HIGH (ref 70–99)
Glucose-Capillary: 174 mg/dL — ABNORMAL HIGH (ref 70–99)
Glucose-Capillary: 145 mg/dL — ABNORMAL HIGH (ref 70–99)
Glucose-Capillary: 143 mg/dL — ABNORMAL HIGH (ref 70–99)

## 2020-12-05 LAB — CBC
HCT: 28.2 % — ABNORMAL LOW (ref 39.0–52.0)
Hemoglobin: 8.5 g/dL — ABNORMAL LOW (ref 13.0–17.0)
MCH: 27.5 pg (ref 26.0–34.0)
MCHC: 30.1 g/dL (ref 30.0–36.0)
MCV: 91.3 fL (ref 80.0–100.0)
Platelets: 488 10*3/uL — ABNORMAL HIGH (ref 150–400)
RBC: 3.09 MIL/uL — ABNORMAL LOW (ref 4.22–5.81)
RDW: 17.8 % — ABNORMAL HIGH (ref 11.5–15.5)
WBC: 17.9 10*3/uL — ABNORMAL HIGH (ref 4.0–10.5)
nRBC: 0 % (ref 0.0–0.2)

## 2020-12-05 LAB — PREALBUMIN: Prealbumin: 9.3 mg/dL — ABNORMAL LOW (ref 18–38)

## 2020-12-05 LAB — COMPREHENSIVE METABOLIC PANEL
ALT: 37 U/L (ref 0–44)
AST: 45 U/L — ABNORMAL HIGH (ref 15–41)
Albumin: 1.7 g/dL — ABNORMAL LOW (ref 3.5–5.0)
Alkaline Phosphatase: 51 U/L (ref 38–126)
Anion gap: 4 — ABNORMAL LOW (ref 5–15)
BUN: 19 mg/dL (ref 6–20)
CO2: 28 mmol/L (ref 22–32)
Calcium: 8 mg/dL — ABNORMAL LOW (ref 8.9–10.3)
Chloride: 113 mmol/L — ABNORMAL HIGH (ref 98–111)
Creatinine, Ser: 0.93 mg/dL (ref 0.61–1.24)
GFR, Estimated: >60 mL/min (ref >60)
Glucose, Bld: 130 mg/dL — ABNORMAL HIGH (ref 70–99)
Potassium: 3.8 mmol/L (ref 3.5–5.1)
Sodium: 145 mmol/L (ref 135–145)
Total Bilirubin: 1 mg/dL (ref 0.3–1.2)
Total Protein: 6.1 g/dL — ABNORMAL LOW (ref 6.5–8.1)

## 2020-12-05 LAB — MAGNESIUM: Magnesium: 2.2 mg/dL (ref 1.7–2.4)

## 2020-12-05 LAB — PHOSPHORUS: Phosphorus: 3.2 mg/dL (ref 2.5–4.6)

## 2020-12-05 MED ORDER — HYDROMORPHONE HCL 1 MG/ML IJ SOLN
0.5000 mg | INTRAMUSCULAR | Status: DC | PRN
  Administered 2020-12-05 – 2020-12-06 (×3): 0.5 mg via INTRAVENOUS
  Filled 2020-12-05 (×3): qty 1

## 2020-12-05 MED ORDER — INSULIN ASPART 100 UNIT/ML IJ SOLN
0.0000 [IU] | INTRAMUSCULAR | Status: DC
  Administered 2020-12-05 (×2): 2 [IU] via SUBCUTANEOUS
  Administered 2020-12-05: 3 [IU] via SUBCUTANEOUS
  Administered 2020-12-05: 2 [IU] via SUBCUTANEOUS
  Administered 2020-12-05 – 2020-12-06 (×2): 3 [IU] via SUBCUTANEOUS
  Administered 2020-12-06 (×3): 2 [IU] via SUBCUTANEOUS
  Administered 2020-12-06: 3 [IU] via SUBCUTANEOUS
  Administered 2020-12-07 – 2020-12-09 (×15): 2 [IU] via SUBCUTANEOUS
  Administered 2020-12-09: 3 [IU] via SUBCUTANEOUS
  Administered 2020-12-09 – 2020-12-13 (×6): 2 [IU] via SUBCUTANEOUS

## 2020-12-05 MED ORDER — IPRATROPIUM-ALBUTEROL 0.5-2.5 (3) MG/3ML IN SOLN: 3.0000 mL | RESPIRATORY_TRACT | Status: DC | PRN

## 2020-12-05 MED ORDER — OXYCODONE HCL 5 MG PO TABS
5.0000 mg | ORAL_TABLET | ORAL | Status: DC | PRN
  Administered 2020-12-05 – 2020-12-06 (×3): 5 mg via ORAL
  Filled 2020-12-05 (×3): qty 2

## 2020-12-05 MED ORDER — IPRATROPIUM-ALBUTEROL 0.5-2.5 (3) MG/3ML IN SOLN
RESPIRATORY_TRACT | Status: AC
  Administered 2020-12-05: 3 mL
  Filled 2020-12-05: qty 3

## 2020-12-05 MED ORDER — ACETAMINOPHEN 500 MG PO TABS
1000.0000 mg | ORAL_TABLET | Freq: Four times a day (QID) | ORAL | Status: DC
  Administered 2020-12-05 – 2020-12-06 (×4): 1000 mg via ORAL
  Filled 2020-12-05 (×3): qty 2

## 2020-12-05 MED ORDER — ALUM & MAG HYDROXIDE-SIMETH 200-200-20 MG/5ML PO SUSP: 15.0000 mL | Freq: Four times a day (QID) | ORAL | Status: DC | PRN

## 2020-12-05 MED ORDER — KETOROLAC TROMETHAMINE 15 MG/ML IJ SOLN
30.0000 mg | Freq: Four times a day (QID) | INTRAMUSCULAR | Status: AC
  Administered 2020-12-05 – 2020-12-09 (×20): 30 mg via INTRAVENOUS
  Filled 2020-12-05 (×20): qty 2

## 2020-12-05 MED ORDER — HYDROCHLOROTHIAZIDE 25 MG PO TABS
25.0000 mg | ORAL_TABLET | Freq: Every day | ORAL | Status: DC
  Administered 2020-12-05: 25 mg via ORAL
  Filled 2020-12-05: qty 1

## 2020-12-05 MED ORDER — METHOCARBAMOL 500 MG PO TABS
1000.0000 mg | ORAL_TABLET | Freq: Three times a day (TID) | ORAL | Status: DC
  Administered 2020-12-05 – 2020-12-06 (×2): 1000 mg via ORAL
  Filled 2020-12-05 (×2): qty 2

## 2020-12-05 MED ORDER — HYDROCHLOROTHIAZIDE 25 MG PO TABS: 25.0000 mg | ORAL_TABLET | Freq: Every day | ORAL | Status: DC

## 2020-12-05 MED ORDER — DOCUSATE SODIUM 100 MG PO CAPS
100.0000 mg | ORAL_CAPSULE | Freq: Two times a day (BID) | ORAL | Status: DC
  Administered 2020-12-05: 100 mg via ORAL
  Filled 2020-12-05: qty 1

## 2020-12-05 MED ORDER — TRACE MINERALS CU-MN-SE-ZN 300-55-60-3000 MCG/ML IV SOLN
INTRAVENOUS | Status: AC
  Filled 2020-12-05: qty 1137.6

## 2020-12-05 NOTE — Progress Notes (Signed)
Inpatient Rehab Admissions Coordinator Note:   Per PT/OT recommendations, pt was screened for CIR candidacy by Wolfgang Phoenix, MS, CCC-SLP.  At this time we are not recommending an inpatient rehab consult d/t pt's high oxygen requirements.  Will follow from a distance.  Please contact me with questions.    Wolfgang Phoenix, MS, CCC-SLP Admissions Coordinator [phone] 12/05/20 12:28 PM
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
## 2020-12-05 NOTE — Evaluation (Addendum)
Physical Therapy Evaluation Patient Details Name: Jesse Waters MRN: [id number] DOB: 03-10-1996 Today's Date: 12/05/2020   History of Present Illness  25 y.o. 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 Abdominal closure with incisional vac changed 7/3 ostomy with Abdominal binder sepsis extubated 7/4 PMH includes:  HTN, PNA  Clinical Impression  MD cleared to evaluate pt pre extubation. Pt presents with decreased cardiopulmonary endurance, generalized weakness, and debility. Pt able to participate in a few bed level exercises; requiring two-three person assist this date for bed mobility (safety due to large body habitus and vent). At end of session, pt extubated by RT with sats 93% on 6L O2, HR 119-150 bpm, BP 170/102. Pt would benefit from CIR to address deficits and maximize functional mobility.     Follow Up Recommendations CIR    Equipment Recommendations   (TBA)    Recommendations for Other Services       Precautions / Restrictions Precautions Precautions: Fall Precaution Comments: jp drain L side, wound vac to abdomen, gravity dependent drain R side Restrictions Weight Bearing Restrictions: No      Mobility  Bed Mobility Overal bed mobility: Needs Assistance Bed Mobility: Rolling;Supine to Sit;Sit to Supine Rolling: +2 for physical assistance;Max assist   Supine to sit: +2 for physical assistance;Total assist Sit to supine:  (+3 total max (A))   General bed mobility comments: Pt with extension progression toward [facility] and needs repositioning to keep pt from sliding at EOB. pt requires faciliation of flexion to progress. Pt with L neck rotation toward vent favor and wanting ice on head / neck during session. pt requires total +3 sit<>supine due to lines / leads and BIL LE with foot board off pt grabbing and holding with BIL UE during session to rails to help  with static sitting    Transfers                 General transfer comment: NT  Ambulation/Gait                Stairs            Wheelchair Mobility    Modified Rankin (Stroke Patients Only)       Balance Overall balance assessment: Needs assistance Sitting-balance support: Bilateral upper extremity supported;Feet supported Sitting balance-Leahy Scale: Fair Sitting balance - Comments: static sitting with continued suction from RN via oral yaunkers and pt waving for more at times.                                     Pertinent Vitals/Pain Pain Assessment: No/denies pain    Home Living Family/patient expects to be discharged to:: Private residence Living Arrangements: Parent Available Help at Discharge: Family Type of Home: House Home Access: Ramped entrance     Home Layout: One level Home Equipment: Environmental consultant - 2 wheels;Cane - single point      Prior Function Level of Independence: Independent               Hand Dominance   Dominant Hand: Right    Extremity/Trunk Assessment   Upper Extremity Assessment Upper Extremity Assessment: Generalized weakness    Lower Extremity Assessment Lower Extremity Assessment: Generalized weakness    Cervical / Trunk Assessment Cervical / Trunk Assessment: Normal  Communication   Communication: No difficulties (speaking post extubation)  Cognition Arousal/Alertness: Awake/alert Behavior During Therapy: Flat affect Overall Cognitive Status: Difficult to assess                                 General Comments: anxious post extubation asking RN to not leave him multiple times      General Comments General comments (skin integrity, edema, etc.): [place] 6L o2 93% end of session supine, HR max 150 sustain HR 144 s/p extubation by RT as therapy ends session. Pt require extended rest break now so linen change deferred to RN staff and pt expressing wanting to wait    Exercises  General Exercises - Lower Extremity Ankle Circles/Pumps: AROM;Both;10 reps;Supine Long Arc Quad: Both;10 reps;Seated   Assessment/Plan    PT Assessment Patient needs continued PT services  PT Problem List Decreased strength;Decreased activity tolerance;Decreased balance;Decreased mobility;Obesity;Pain       PT Treatment Interventions DME instruction;Gait training;Functional mobility training;Therapeutic activities;Therapeutic exercise;Balance training;Neuromuscular re-education;Patient/family education    PT Goals (Current goals can be found in the Care Plan section)  Acute Rehab PT Goals Patient Stated Goal: none stated PT Goal Formulation: With patient Time For Goal Achievement: 12/19/20 Potential to Achieve Goals: Fair    Frequency Min 3X/week   Barriers to discharge        Co-evaluation PT/OT/SLP Co-Evaluation/Treatment: Yes Reason for Co-Treatment: Complexity of the patient's impairments (multi-system involvement);Necessary to address cognition/behavior during functional activity;To address functional/ADL transfers;For patient/therapist safety PT goals addressed during session: Mobility/safety with mobility;Strengthening/ROM OT goals addressed during session: ADL's and self-care;Proper use of Adaptive equipment and DME;Strengthening/ROM       AM-PAC PT "6 Clicks" Mobility  Outcome Measure Help needed turning from your back to your side while in a flat bed without using bedrails?: Total Help needed moving from lying on your back to sitting on the side of a flat bed without using bedrails?: Total Help needed moving to and from a bed to a chair (including a wheelchair)?: Total Help needed standing up from a chair using your arms (e.g., wheelchair or bedside chair)?: Total Help needed to walk in hospital room?: Total Help needed climbing 3-5 steps with a railing? : Total 6 Click Score: 6    End of Session Equipment Utilized During Treatment: Oxygen Activity Tolerance:  Other (comment) (tachy, increased secretions, pending extubation) Patient left: in bed;with call bell/phone within reach;with bed alarm set Nurse Communication: Mobility status PT Visit Diagnosis: Muscle weakness (generalized) (M62.81);Difficulty in walking, not elsewhere classified (R26.2);Pain;Unsteadiness on feet (R26.81);Other abnormalities of gait and mobility (R26.89)    Time: 0086-7619 PT Time Calculation (min) (ACUTE ONLY): 30 min   Charges:   PT Evaluation $PT Eval High Complexity: 1 High          Lillia Pauls, PT, DPT Acute Rehabilitation Services Pager [phone] Office [phone]   Norval Morton 12/05/2020, 11:19 AM

## 2020-12-05 NOTE — Progress Notes (Signed)
RT placed patient on HHFNC 60% 20L to help with sats and WOB per MD order. Patient tolerating well at this time. RT will continue to monitor.
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
## 2020-12-05 NOTE — Procedures (Signed)
Extubation Procedure Note  Patient Details:   Name: Jesse Waters DOB: Nov 02, 1995 MRN: [id number]   Airway Documentation:    Vent end date: 12/05/20 Vent end time: 0840   Evaluation  O2 sats: stable throughout Complications: No apparent complications Patient did tolerate procedure well. Bilateral Breath Sounds: Clear, Diminished   Yes  RT extubated patient to 6L [place] per MD order with RN at bedside. Positive cuff leak noted. Patient tolerated well and states that his breathing is comfortable at this time. No stridor or distress noted at this time. RT will continue to monitor as needed.   Jaquelyn Bitter 12/05/2020, 8:54 AM

## 2020-12-05 NOTE — Consult Note (Signed)
WOC Nurse wound follow up Midline abdominal surgical wound with NPWT (VAC) in place.  Dr  Bedelia Person changed yesterday and would like it left in place until 12/07/20.  Will change every Mon/Wed/Friday going forward  WOC Nurse ostomy follow up Stoma type/location: LLQ colostomy Stomal assessment/size: 1 3/4" well budded. 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  Patient is slathered in Aquaphor and pouch seal is weak and reinforced with Hy tape.  Treatment options for stomal/peristomal skin: 2 piece 2 3/4" pouch.  Has abdominal binder in place and midline abdominal incision with VAC in place.  Output soft brown stool.  Ostomy pouching: 2pc. 2 3/4" pouch  Education provided: Informed patient we would change pouch and VAC dressing Mon/Wed/ Fri.  He is in agreement.  Enrolled patient in [place] Secure Start Discharge program: No Will follow.  Maple Hudson MSN, RN, FNP-BC CWON Wound, Ostomy, Continence Nurse Pager [phone]

## 2020-12-06 ENCOUNTER — Inpatient Hospital Stay (HOSPITAL_COMMUNITY): Payer: No Typology Code available for payment source | Admitting: Certified Registered Nurse Anesthetist

## 2020-12-06 ENCOUNTER — Encounter (HOSPITAL_COMMUNITY): Payer: Self-pay | Admitting: Surgery

## 2020-12-06 ENCOUNTER — Inpatient Hospital Stay (HOSPITAL_COMMUNITY): Payer: No Typology Code available for payment source

## 2020-12-06 LAB — POCT I-STAT 7, (LYTES, BLD GAS, ICA,H+H)
Acid-Base Excess: 4 mmol/L — ABNORMAL HIGH (ref 0.0–2.0)
Bicarbonate: 29.6 mmol/L — ABNORMAL HIGH (ref 20.0–28.0)
Calcium, Ion: 1.13 mmol/L — ABNORMAL LOW (ref 1.15–1.40)
HCT: 25 % — ABNORMAL LOW (ref 39.0–52.0)
Hemoglobin: 8.5 g/dL — ABNORMAL LOW (ref 13.0–17.0)
O2 Saturation: 100 %
Patient temperature: 98.4
Potassium: 4 mmol/L (ref 3.5–5.1)
Sodium: 145 mmol/L (ref 135–145)
TCO2: 31 mmol/L (ref 22–32)
pCO2 arterial: 50.7 mmHg — ABNORMAL HIGH (ref 32.0–48.0)
pH, Arterial: 7.373 (ref 7.350–7.450)
pO2, Arterial: 199 mmHg — ABNORMAL HIGH (ref 83.0–108.0)

## 2020-12-06 LAB — BASIC METABOLIC PANEL
Anion gap: 8 (ref 5–15)
BUN: 28 mg/dL — ABNORMAL HIGH (ref 6–20)
CO2: 27 mmol/L (ref 22–32)
Calcium: 8.4 mg/dL — ABNORMAL LOW (ref 8.9–10.3)
Chloride: 107 mmol/L (ref 98–111)
Creatinine, Ser: 0.97 mg/dL (ref 0.61–1.24)
GFR, Estimated: >60 mL/min (ref >60)
Glucose, Bld: 141 mg/dL — ABNORMAL HIGH (ref 70–99)
Potassium: 4.1 mmol/L (ref 3.5–5.1)
Sodium: 142 mmol/L (ref 135–145)

## 2020-12-06 LAB — URINALYSIS, ROUTINE W REFLEX MICROSCOPIC
Bilirubin Urine: NEGATIVE
Glucose, UA: NEGATIVE mg/dL
Hgb urine dipstick: NEGATIVE
Ketones, ur: NEGATIVE mg/dL
Leukocytes,Ua: NEGATIVE
Nitrite: NEGATIVE
Protein, ur: NEGATIVE mg/dL
Specific Gravity, Urine: 1.039 — ABNORMAL HIGH (ref 1.005–1.030)
pH: 5 (ref 5.0–8.0)

## 2020-12-06 LAB — CBC
HCT: 28.1 % — ABNORMAL LOW (ref 39.0–52.0)
Hemoglobin: 8.7 g/dL — ABNORMAL LOW (ref 13.0–17.0)
MCH: 28.1 pg (ref 26.0–34.0)
MCHC: 31 g/dL (ref 30.0–36.0)
MCV: 90.6 fL (ref 80.0–100.0)
Platelets: 591 10*3/uL — ABNORMAL HIGH (ref 150–400)
RBC: 3.1 MIL/uL — ABNORMAL LOW (ref 4.22–5.81)
RDW: 18 % — ABNORMAL HIGH (ref 11.5–15.5)
WBC: 17.5 10*3/uL — ABNORMAL HIGH (ref 4.0–10.5)
nRBC: 0.1 % (ref 0.0–0.2)

## 2020-12-06 LAB — GLUCOSE, CAPILLARY
Glucose-Capillary: 129 mg/dL — ABNORMAL HIGH (ref 70–99)
Glucose-Capillary: 137 mg/dL — ABNORMAL HIGH (ref 70–99)
Glucose-Capillary: 137 mg/dL — ABNORMAL HIGH (ref 70–99)
Glucose-Capillary: 144 mg/dL — ABNORMAL HIGH (ref 70–99)
Glucose-Capillary: 153 mg/dL — ABNORMAL HIGH (ref 70–99)
Glucose-Capillary: 158 mg/dL — ABNORMAL HIGH (ref 70–99)
Glucose-Capillary: 160 mg/dL — ABNORMAL HIGH (ref 70–99)

## 2020-12-06 LAB — PHOSPHORUS: Phosphorus: 4.1 mg/dL (ref 2.5–4.6)

## 2020-12-06 LAB — MAGNESIUM: Magnesium: 2.3 mg/dL (ref 1.7–2.4)

## 2020-12-06 MED ORDER — HYDROCHLOROTHIAZIDE 25 MG PO TABS
25.0000 mg | ORAL_TABLET | Freq: Every day | ORAL | Status: DC
  Administered 2020-12-06 – 2020-12-07 (×2): 25 mg
  Filled 2020-12-06 (×3): qty 1

## 2020-12-06 MED ORDER — POLYETHYLENE GLYCOL 3350 17 G PO PACK: 17.0000 g | PACK | Freq: Every day | ORAL | Status: DC

## 2020-12-06 MED ORDER — PROPOFOL 1000 MG/100ML IV EMUL
INTRAVENOUS | Status: AC
  Administered 2020-12-06: 25 ug/kg/min via INTRAVENOUS
  Filled 2020-12-06: qty 100

## 2020-12-06 MED ORDER — ALUM & MAG HYDROXIDE-SIMETH 200-200-20 MG/5ML PO SUSP
15.0000 mL | Freq: Four times a day (QID) | ORAL | Status: DC | PRN
Start: 2020-12-06 — End: 2020-12-09

## 2020-12-06 MED ORDER — SUCCINYLCHOLINE CHLORIDE 200 MG/10ML IV SOSY
PREFILLED_SYRINGE | INTRAVENOUS | Status: DC | PRN
  Administered 2020-12-06: 140 mg via INTRAVENOUS

## 2020-12-06 MED ORDER — PROPOFOL 1000 MG/100ML IV EMUL
0.0000 ug/kg/min | INTRAVENOUS | Status: DC
  Administered 2020-12-06: 45 ug/kg/min via INTRAVENOUS
  Administered 2020-12-06: 30 ug/kg/min via INTRAVENOUS
  Administered 2020-12-06: 25 ug/kg/min via INTRAVENOUS
  Administered 2020-12-06: 35 ug/kg/min via INTRAVENOUS
  Administered 2020-12-07 – 2020-12-08 (×6): 20 ug/kg/min via INTRAVENOUS
  Administered 2020-12-08: 30 ug/kg/min via INTRAVENOUS
  Filled 2020-12-06 (×12): qty 100

## 2020-12-06 MED ORDER — DOCUSATE SODIUM 50 MG/5ML PO LIQD
100.0000 mg | Freq: Two times a day (BID) | ORAL | Status: DC
Start: 2020-12-06 — End: 2020-12-06

## 2020-12-06 MED ORDER — FENTANYL 2500MCG IN NS 250ML (10MCG/ML) PREMIX INFUSION
0.0000 ug/h | INTRAVENOUS | Status: DC
  Administered 2020-12-06: 150 ug/h via INTRAVENOUS
  Administered 2020-12-06: 100 ug/h via INTRAVENOUS
  Administered 2020-12-07 (×2): 300 ug/h via INTRAVENOUS
  Administered 2020-12-07: 350 ug/h via INTRAVENOUS
  Administered 2020-12-08: 400 ug/h via INTRAVENOUS
  Filled 2020-12-06 (×7): qty 250

## 2020-12-06 MED ORDER — FENTANYL BOLUS VIA INFUSION
50.0000 ug | INTRAVENOUS | Status: DC | PRN
Start: 2020-12-06 — End: 2020-12-09
  Filled 2020-12-06: qty 100

## 2020-12-06 MED ORDER — PIPERACILLIN-TAZOBACTAM 3.375 G IVPB
3.3750 g | Freq: Three times a day (TID) | INTRAVENOUS | Status: DC
  Administered 2020-12-06 – 2020-12-14 (×23): 3.375 g via INTRAVENOUS
  Filled 2020-12-06 (×24): qty 50

## 2020-12-06 MED ORDER — HYDROMORPHONE HCL 1 MG/ML IJ SOLN
1.0000 mg | INTRAMUSCULAR | Status: DC | PRN
  Administered 2020-12-06 – 2020-12-08 (×7): 1 mg via INTRAVENOUS
  Filled 2020-12-06 (×6): qty 1

## 2020-12-06 MED ORDER — OXYCODONE HCL 5 MG/5ML PO SOLN
5.0000 mg | ORAL | Status: DC | PRN
  Administered 2020-12-06 – 2020-12-08 (×5): 10 mg
  Filled 2020-12-06 (×5): qty 10

## 2020-12-06 MED ORDER — TRACE MINERALS CU-MN-SE-ZN 300-55-60-3000 MCG/ML IV SOLN
INTRAVENOUS | Status: AC
  Filled 2020-12-06: qty 1137.6

## 2020-12-06 MED ORDER — FUROSEMIDE 10 MG/ML IJ SOLN
20.0000 mg | Freq: Once | INTRAMUSCULAR | Status: AC
  Administered 2020-12-06: 20 mg via INTRAVENOUS
  Filled 2020-12-06: qty 2

## 2020-12-06 MED ORDER — DOCUSATE SODIUM 50 MG/5ML PO LIQD
100.0000 mg | Freq: Two times a day (BID) | ORAL | Status: DC
  Administered 2020-12-06 – 2020-12-07 (×4): 100 mg
  Filled 2020-12-06 (×5): qty 10

## 2020-12-06 MED ORDER — PANTOPRAZOLE SODIUM 40 MG IV SOLR
40.0000 mg | Freq: Two times a day (BID) | INTRAVENOUS | Status: DC
  Administered 2020-12-06 – 2020-12-08 (×5): 40 mg via INTRAVENOUS
  Filled 2020-12-06 (×5): qty 40

## 2020-12-06 MED ORDER — LIDOCAINE 2% (20 MG/ML) 5 ML SYRINGE
INTRAMUSCULAR | Status: DC | PRN
  Administered 2020-12-06: 80 mg via INTRAVENOUS

## 2020-12-06 MED ORDER — ACETAMINOPHEN 500 MG PO TABS
1000.0000 mg | ORAL_TABLET | Freq: Four times a day (QID) | ORAL | Status: DC
  Administered 2020-12-06 – 2020-12-08 (×6): 1000 mg
  Filled 2020-12-06 (×8): qty 2

## 2020-12-06 MED ORDER — POLYETHYLENE GLYCOL 3350 17 G PO PACK
17.0000 g | PACK | Freq: Every day | ORAL | Status: DC
  Administered 2020-12-07: 17 g
  Filled 2020-12-06 (×2): qty 1

## 2020-12-06 MED ORDER — PROPOFOL 10 MG/ML IV BOLUS
INTRAVENOUS | Status: DC | PRN
  Administered 2020-12-06: 200 mg via INTRAVENOUS

## 2020-12-06 MED ORDER — FENTANYL CITRATE (PF) 100 MCG/2ML IJ SOLN
50.0000 ug | Freq: Once | INTRAMUSCULAR | Status: AC
  Administered 2020-12-06: 50 ug via INTRAVENOUS

## 2020-12-06 MED ORDER — METHOCARBAMOL 500 MG PO TABS
1000.0000 mg | ORAL_TABLET | Freq: Three times a day (TID) | ORAL | Status: DC
  Administered 2020-12-06 – 2020-12-08 (×6): 1000 mg
  Filled 2020-12-06 (×7): qty 2

## 2020-12-06 MED ORDER — IOHEXOL 300 MG/ML  SOLN
100.0000 mL | Freq: Once | INTRAMUSCULAR | Status: AC | PRN
  Administered 2020-12-06: 100 mL via INTRAVENOUS

## 2020-12-06 NOTE — Progress Notes (Signed)
RN called RT bedside for intubation. Anesthesia intubated patient with MD, RT and RN bedside. RT secured ETT and placed patient on ventilator. RT will continue to monitor.

## 2020-12-06 NOTE — Progress Notes (Signed)
RT transported patient with RN from 4N25 to CT and back. No complications and vital signs stable. RT will continue to monitor.

## 2020-12-06 NOTE — Addendum Note (Signed)
Addendum  created 12/06/20 1750 by Marcene Duos, MD   Attestation recorded in [facility], Intraprocedure Attestations filed
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
## 2020-12-06 NOTE — Progress Notes (Signed)
Updated Dr. Bedelia Person that OGT output has become brown with coffee grounds/sediment and intermittently red tinged. New orders received for protonix, see MAR

## 2020-12-06 NOTE — Transfer of Care (Signed)
Immediate Anesthesia Transfer of Care Note  Patient: [facility]  Procedure(s) Performed: AN AD HOC INTUBATION  Patient Location: ICU  Anesthesia Type:General  Level of Consciousness: sedated  Airway & Oxygen Therapy: Patient remains intubated per anesthesia plan and Patient placed on Ventilator (see vital sign flow sheet for setting)  Post-op Assessment: Report given to RN and Post -op Vital signs reviewed and stable  Post vital signs: Reviewed and stable  Last Vitals:  Vitals Value Taken Time  BP 107/63 12/06/20 0740  Temp    Pulse 125 12/06/20 0744  Resp 25 12/06/20 0744  SpO2 98 % 12/06/20 0744  Vitals shown include unvalidated device data.  Last Pain:  Vitals:   12/06/20 0558  TempSrc:   PainSc: 7       Patients Stated Pain Goal: 2 (11/29/20 2000)  Complications: No notable events documented.

## 2020-12-06 NOTE — Progress Notes (Signed)
PT Cancellation Note  Patient Details Name: Jesse Waters MRN: [id number] DOB: [birth year]   Cancelled Treatment:    Reason Eval/Treat Not Completed: Medical issues which prohibited therapy.  Pt heading for a Stat CT.  Will check on pt tomorrow 7/6 and see as able. 12/06/2020  Jacinto Halim., PT Acute Rehabilitation Services [phone]  (pager) [phone]  (office)   [name] 12/06/2020, 10:33 AM

## 2020-12-06 NOTE — Progress Notes (Signed)
OT Cancellation Note  Patient Details Name: Cartier Washko MRN: [id number] DOB: [birth year]   Cancelled Treatment:    Reason Eval/Treat Not Completed: Patient not medically ready (stat CT / intubated this AM)  Wynona Neat, OTR/L  Acute Rehabilitation Services Pager: [phone] Office: [phone] .  12/06/2020, 10:30 AM

## 2020-12-06 NOTE — Progress Notes (Signed)
RT obtained ABG and results as follows: 7.37/50.7/199/29.6 RT turned FiO2 down to 60% and increased RR to 24 per Dr.Lovick. RT will continue to monitor.

## 2020-12-06 NOTE — Anesthesia Postprocedure Evaluation (Signed)
Anesthesia Post Note  Patient: Jesse Waters  Procedure(s) Performed: AN AD HOC INTUBATION     Patient location during evaluation: ICU Anesthesia Type: General Level of consciousness: sedated Pain management: satisfactory to patient Vital Signs Assessment: post-procedure vital signs reviewed and stable Respiratory status: patient on ventilator - see flowsheet for VS Cardiovascular status: stable Anesthetic complications: no Comments: Ad Hoc intubation 4N ICU   No notable events documented.  Last Vitals:  Vitals:   12/06/20 0500 12/06/20 0715  BP: (!) 175/102 (!) 133/114  Pulse: (!) 120 (!) 119  Resp: (!) 43 (!) 34  Temp:    SpO2: 96% 97%    Last Pain:  Vitals:   12/06/20 0558  TempSrc:   PainSc: 7                  Lonia Mad

## 2020-12-06 NOTE — Progress Notes (Signed)
Pt's pain not well controlled, RR 40-50's sustaining. Notified MD Lovick. Orders to increase prn Dilaudid to 1mg  per hour. Will continue to monitor
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
## 2020-12-07 ENCOUNTER — Inpatient Hospital Stay (HOSPITAL_COMMUNITY): Payer: No Typology Code available for payment source

## 2020-12-07 LAB — COMPREHENSIVE METABOLIC PANEL
ALT: 77 U/L — ABNORMAL HIGH (ref 0–44)
AST: 50 U/L — ABNORMAL HIGH (ref 15–41)
Albumin: 1.8 g/dL — ABNORMAL LOW (ref 3.5–5.0)
Alkaline Phosphatase: 48 U/L (ref 38–126)
Anion gap: 7 (ref 5–15)
BUN: 36 mg/dL — ABNORMAL HIGH (ref 6–20)
CO2: 26 mmol/L (ref 22–32)
Calcium: 8 mg/dL — ABNORMAL LOW (ref 8.9–10.3)
Chloride: 107 mmol/L (ref 98–111)
Creatinine, Ser: 1.23 mg/dL (ref 0.61–1.24)
GFR, Estimated: >60 mL/min (ref >60)
Glucose, Bld: 121 mg/dL — ABNORMAL HIGH (ref 70–99)
Potassium: 3.3 mmol/L — ABNORMAL LOW (ref 3.5–5.1)
Sodium: 140 mmol/L (ref 135–145)
Total Bilirubin: 0.7 mg/dL (ref 0.3–1.2)
Total Protein: 6.1 g/dL — ABNORMAL LOW (ref 6.5–8.1)

## 2020-12-07 LAB — GLUCOSE, CAPILLARY
Glucose-Capillary: 124 mg/dL — ABNORMAL HIGH (ref 70–99)
Glucose-Capillary: 121 mg/dL — ABNORMAL HIGH (ref 70–99)
Glucose-Capillary: 124 mg/dL — ABNORMAL HIGH (ref 70–99)
Glucose-Capillary: 124 mg/dL — ABNORMAL HIGH (ref 70–99)
Glucose-Capillary: 118 mg/dL — ABNORMAL HIGH (ref 70–99)
Glucose-Capillary: 147 mg/dL — ABNORMAL HIGH (ref 70–99)

## 2020-12-07 LAB — CBC
HCT: 23.6 % — ABNORMAL LOW (ref 39.0–52.0)
Hemoglobin: 7.5 g/dL — ABNORMAL LOW (ref 13.0–17.0)
MCH: 28.3 pg (ref 26.0–34.0)
MCHC: 31.8 g/dL (ref 30.0–36.0)
MCV: 89.1 fL (ref 80.0–100.0)
Platelets: 499 10*3/uL — ABNORMAL HIGH (ref 150–400)
RBC: 2.65 MIL/uL — ABNORMAL LOW (ref 4.22–5.81)
RDW: 17.7 % — ABNORMAL HIGH (ref 11.5–15.5)
WBC: 13.6 10*3/uL — ABNORMAL HIGH (ref 4.0–10.5)
nRBC: 0.1 % (ref 0.0–0.2)

## 2020-12-07 LAB — TRIGLYCERIDES: Triglycerides: 329 mg/dL — ABNORMAL HIGH (ref <150)

## 2020-12-07 MED ORDER — POTASSIUM CHLORIDE 10 MEQ/50ML IV SOLN
10.0000 meq | INTRAVENOUS | Status: AC
  Administered 2020-12-07 (×3): 10 meq via INTRAVENOUS
  Filled 2020-12-07: qty 50

## 2020-12-07 MED ORDER — SODIUM CHLORIDE 0.9 % IV SOLN
INTRAVENOUS | Status: DC | PRN
  Administered 2020-12-07 (×2): 250 mL via INTRAVENOUS

## 2020-12-07 MED ORDER — TRACE MINERALS CU-MN-SE-ZN 300-55-60-3000 MCG/ML IV SOLN
INTRAVENOUS | Status: AC
  Filled 2020-12-07: qty 1137.6

## 2020-12-07 MED ORDER — METOCLOPRAMIDE HCL 5 MG/ML IJ SOLN
5.0000 mg | Freq: Once | INTRAMUSCULAR | Status: AC
  Administered 2020-12-07: 5 mg via INTRAVENOUS

## 2020-12-07 NOTE — Procedures (Signed)
Cortrak  Tube Type:  Cortrak - 43 inches Tube Location:  Left nare Initial Placement:  Postpyloric Secured by: Bridle Technique Used to Measure Tube Placement:  Marking at nare/corner of mouth Cortrak Secured At:  100 cm  Cortrak Tube Team Note:  Consult received to place a Cortrak feeding tube.   X-ray is required, abdominal x-ray has been ordered by the Cortrak team. Please confirm tube placement before using the Cortrak tube.   If the tube becomes dislodged please keep the tube and contact the Cortrak team at [email or web address] ([email or web address]) for replacement.  If after hours and replacement cannot be delayed, place a NG tube and confirm placement with an abdominal x-ray.    Barney Gertsch MS, RD, LDN Please refer to AMION for RD and/or RD on-call/weekend/after hours pager   

## 2020-12-07 NOTE — TOC Progression Note (Signed)
Transition of Care [facility]) - Progression Note    Patient Details  Name: Rajan Burgard MRN: [id number] Date of Birth: 08-17-1995  [facility]) CM/SW Contact  Glennon Mac, RN Phone Number: 12/07/2020, 4:28 PM  Clinical Narrative:   Patient currently remains intubated and on TPN.  Per bedside nurse, may extubate tomorrow.  Will continue to follow as patient progresses.    Expected Discharge Plan: IP Rehab Facility Barriers to Discharge: Continued Medical Work up  Expected Discharge Plan and Services Expected Discharge Plan: IP Rehab Facility   Discharge Planning Services: CM Consult   Living arrangements for the past 2 months: Single Family Home                                       Social Determinants of Health (SDOH) Interventions    Readmission Risk Interventions No flowsheet data found.  Quintella Baton, RN, BSN  Trauma/Neuro ICU Case Manager [phone]
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
## 2020-12-07 NOTE — Consult Note (Addendum)
WOC Nurse wound follow up Patient receiving care in [facility] 4N25 Wound type: Surgical. Abdominal wound with island of skin between the lower wound that consist of the navel and surgical staples.  Measurements: Upper abd wound measures 15 cm x 4 cm x 3.2 cm. Lower abd wound measures 5 cm x 2 cm x 3.2 cm with tunneling at 12:00 measuring 3.2 cm  Wound bed: Beefy red, friable Drainage (amount, consistency, odor) Purulent drainage in canister.  Periwound: scattered psoriasis throughout the abdominal region making it difficult for the drape to stick in place Dressing procedure/placement/frequency: Dressing removed which included 2 pieces of black foam. 3 pieces of black foam used with attempt to bridge the upper and lower wound, encountered leaking issues, therefore tried 2 separate trac pads, 1 for upper wound and 1 for the lower wound with a "Y" connector. Suction obtained at 125 mmHg. Any other leakage issues please page and the WOC team will assist if needed. Ordered large foam dressings (lawson # [phone]) [place] # [phone]) Trac pads [place] # [phone])  WOC Nurse ostomy follow up Stoma type/location:  LLQ colostomy Stomal assessment/size: 1 3/4" budded moist and pink Peristomal assessment: intact Treatment options for stomal/peristomal skin:  28M skin barrier wipes used [place] # [phone]) Output; Mushy green/brown Ostomy pouching: 2pc. 2 1/4 used for smaller skin barrier next to the wound. Education provided: None Enrolled patient in [place] Secure Start Discharge program: No  2 piece pouching system   Fecal pouches , [place] #[phone]            Skin Wendi Maya # [address] [place] # [phone]   Monitor the wound area(s) for worsening of condition such as: Signs/symptoms of infection, increase in size, development of or worsening of odor, development of pain, or increased pain at the affected locations.   Notify the medical team if any of these develop.  Thank you for the consult.  WOC nurse will follow M W F  Please re-consult the WOC team if needed.  Renaldo Reel Katrinka Blazing, MSN, RN, CMSRN, Angus Seller, [facility] Wound Treatment Associate Pager [phone]
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
## 2020-12-07 NOTE — Consult Note (Signed)
WOC Nurse wound follow up Vac continues to leak therefore the dressing was taken off and re-applied. 2 pieces of black foam removed. The surrounding skin was cleaned thoroughly with alcohol pads. The upper area of skin on the abdomen was draped with 4 Mepilex borders size 5 x 12.5 cm . 1 piece of black foam placed into the upper wound, 1 piece placed into the lower wound. A small piece of drape was placed over the skin that connects the 2 wounds, then 1 small piece of black foam to bridge the 2 wounds together. Drape applied, immediate suction obtained at 125 mm Hg.   WOC is available M-F 8:00-3:00. Please page if needed. WOC will FU on M W F.  Renaldo Reel Katrinka Blazing, MSN, RN, CMSRN, Angus Seller, [facility] Wound Treatment Associate Pager [phone]

## 2020-12-07 NOTE — Plan of Care (Signed)
  Problem: Nutrition: Goal: Adequate nutrition will be maintained Outcome: Progressing   Problem: Role Relationship: Goal: Method of communication will improve Outcome: Progressing   Problem: Clinical Measurements: Goal: Will remain free from infection Outcome: Not Progressing Goal: Respiratory complications will improve Outcome: Not Progressing   Problem: Elimination: Goal: Will not experience complications related to urinary retention Outcome: Not Progressing   Problem: Clinical Measurements: Goal: Postoperative complications will be avoided or minimized Outcome: Not Progressing
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
## 2020-12-08 LAB — COMPREHENSIVE METABOLIC PANEL
ALT: 61 U/L — ABNORMAL HIGH (ref 0–44)
AST: 36 U/L (ref 15–41)
Albumin: 1.7 g/dL — ABNORMAL LOW (ref 3.5–5.0)
Alkaline Phosphatase: 50 U/L (ref 38–126)
Anion gap: 6 (ref 5–15)
BUN: 29 mg/dL — ABNORMAL HIGH (ref 6–20)
CO2: 24 mmol/L (ref 22–32)
Calcium: 7.5 mg/dL — ABNORMAL LOW (ref 8.9–10.3)
Chloride: 107 mmol/L (ref 98–111)
Creatinine, Ser: 0.97 mg/dL (ref 0.61–1.24)
GFR, Estimated: >60 mL/min (ref >60)
Glucose, Bld: 84 mg/dL (ref 70–99)
Potassium: 3.3 mmol/L — ABNORMAL LOW (ref 3.5–5.1)
Sodium: 137 mmol/L (ref 135–145)
Total Bilirubin: 0.7 mg/dL (ref 0.3–1.2)
Total Protein: 5.7 g/dL — ABNORMAL LOW (ref 6.5–8.1)

## 2020-12-08 LAB — GLUCOSE, CAPILLARY
Glucose-Capillary: 122 mg/dL — ABNORMAL HIGH (ref 70–99)
Glucose-Capillary: 129 mg/dL — ABNORMAL HIGH (ref 70–99)
Glucose-Capillary: 126 mg/dL — ABNORMAL HIGH (ref 70–99)
Glucose-Capillary: 120 mg/dL — ABNORMAL HIGH (ref 70–99)
Glucose-Capillary: 129 mg/dL — ABNORMAL HIGH (ref 70–99)

## 2020-12-08 LAB — PHOSPHORUS: Phosphorus: 4 mg/dL (ref 2.5–4.6)

## 2020-12-08 LAB — MAGNESIUM: Magnesium: 1.9 mg/dL (ref 1.7–2.4)

## 2020-12-08 MED ORDER — SODIUM ACETATE 2 MEQ/ML IV SOLN
INTRAVENOUS | Status: AC
Start: 2020-12-08 — End: 2020-12-09
  Filled 2020-12-08: qty 1137.6

## 2020-12-08 MED ORDER — ORAL CARE MOUTH RINSE
15.0000 mL | Freq: Two times a day (BID) | OROMUCOSAL | Status: DC
  Administered 2020-12-09 – 2020-12-13 (×8): 15 mL via OROMUCOSAL

## 2020-12-08 MED ORDER — POTASSIUM CHLORIDE 10 MEQ/50ML IV SOLN
10.0000 meq | INTRAVENOUS | Status: AC
  Administered 2020-12-08 (×5): 10 meq via INTRAVENOUS
  Filled 2020-12-08 (×5): qty 50

## 2020-12-08 MED ORDER — MORPHINE SULFATE (PF) 4 MG/ML IV SOLN
4.0000 mg | INTRAVENOUS | Status: DC | PRN
  Administered 2020-12-08 – 2020-12-09 (×3): 4 mg via INTRAVENOUS
  Filled 2020-12-08 (×3): qty 1

## 2020-12-08 MED ORDER — CHLORHEXIDINE GLUCONATE 0.12 % MT SOLN
15.0000 mL | Freq: Two times a day (BID) | OROMUCOSAL | Status: DC
  Administered 2020-12-08 – 2020-12-15 (×10): 15 mL via OROMUCOSAL
  Filled 2020-12-08 (×9): qty 15

## 2020-12-08 NOTE — Progress Notes (Signed)
Patient's CorTrak removed by accident during extubation.  2mL of fentanyl wasted in stericycle with Kathaleen Maser RN.
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
## 2020-12-08 NOTE — Progress Notes (Signed)
eLink Physician-Brief Progress Note Patient Name: Jesse Waters DOB: [birth year] MRN: [id number]   Date of Service  12/08/2020  HPI/Events of Note  Patient states dilaudid makes him hallucinate and his stomach hurt. RN requests change to another medication for pain.   eICU Interventions  Discontinued Dilaudid. Ordered morphine 4mg  IV Q4H PRN severe pain.     Intervention Category Intermediate Interventions: Pain - evaluation and management  12/08/2020, 8:46 PM
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
## 2020-12-08 NOTE — Procedures (Signed)
Extubation Procedure Note  Patient Details:   Name: Jesse Waters DOB: 03/26/1996 MRN: [id number]   Airway Documentation:    Vent end date: 12/08/20 Vent end time: 0957   Evaluation  O2 sats: stable throughout Complications: No apparent complications Patient did tolerate procedure well. Bilateral Breath Sounds: Clear, Diminished   Yes Placed on 4L min [place] Incentive spirometer instructed and performed  Newt Lukes 12/08/2020, 9:58 AM
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
## 2020-12-09 ENCOUNTER — Inpatient Hospital Stay (HOSPITAL_COMMUNITY): Payer: No Typology Code available for payment source

## 2020-12-09 LAB — TRIGLYCERIDES: Triglycerides: 334 mg/dL — ABNORMAL HIGH (ref <150)

## 2020-12-09 LAB — CBC WITH DIFFERENTIAL/PLATELET
Abs Immature Granulocytes: 0.39 10*3/uL — ABNORMAL HIGH (ref 0.00–0.07)
Abs Immature Granulocytes: 0.46 10*3/uL — ABNORMAL HIGH (ref 0.00–0.07)
Basophils Absolute: 0.1 10*3/uL (ref 0.0–0.1)
Basophils Absolute: 0 10*3/uL (ref 0.0–0.1)
Basophils Relative: 0 %
Basophils Relative: 0 %
Eosinophils Absolute: 0.3 10*3/uL (ref 0.0–0.5)
Eosinophils Absolute: 0.3 10*3/uL (ref 0.0–0.5)
Eosinophils Relative: 2 %
Eosinophils Relative: 1 %
HCT: 26.2 % — ABNORMAL LOW (ref 39.0–52.0)
HCT: 27.2 % — ABNORMAL LOW (ref 39.0–52.0)
Hemoglobin: 7.3 g/dL — ABNORMAL LOW (ref 13.0–17.0)
Hemoglobin: 8.6 g/dL — ABNORMAL LOW (ref 13.0–17.0)
Immature Granulocytes: 3 %
Immature Granulocytes: 3 %
Lymphocytes Relative: 13 %
Lymphocytes Relative: 12 %
Lymphs Abs: 2.1 10*3/uL (ref 0.7–4.0)
Lymphs Abs: 2.1 10*3/uL (ref 0.7–4.0)
MCH: 28.4 pg (ref 26.0–34.0)
MCH: 27.8 pg (ref 26.0–34.0)
MCHC: 27.9 g/dL — ABNORMAL LOW (ref 30.0–36.0)
MCHC: 31.6 g/dL (ref 30.0–36.0)
MCV: 101.9 fL — ABNORMAL HIGH (ref 80.0–100.0)
MCV: 88 fL (ref 80.0–100.0)
Monocytes Absolute: 1 10*3/uL (ref 0.1–1.0)
Monocytes Absolute: 1.1 10*3/uL — ABNORMAL HIGH (ref 0.1–1.0)
Monocytes Relative: 6 %
Monocytes Relative: 6 %
Neutro Abs: 12 10*3/uL — ABNORMAL HIGH (ref 1.7–7.7)
Neutro Abs: 13.8 10*3/uL — ABNORMAL HIGH (ref 1.7–7.7)
Neutrophils Relative %: 76 %
Neutrophils Relative %: 78 %
Platelets: 540 10*3/uL — ABNORMAL HIGH (ref 150–400)
Platelets: 633 10*3/uL — ABNORMAL HIGH (ref 150–400)
RBC: 2.57 MIL/uL — ABNORMAL LOW (ref 4.22–5.81)
RBC: 3.09 MIL/uL — ABNORMAL LOW (ref 4.22–5.81)
RDW: 18.6 % — ABNORMAL HIGH (ref 11.5–15.5)
RDW: 17.1 % — ABNORMAL HIGH (ref 11.5–15.5)
WBC: 15.8 10*3/uL — ABNORMAL HIGH (ref 4.0–10.5)
WBC: 17.7 10*3/uL — ABNORMAL HIGH (ref 4.0–10.5)
nRBC: 0.1 % (ref 0.0–0.2)
nRBC: 0 % (ref 0.0–0.2)

## 2020-12-09 LAB — GLUCOSE, CAPILLARY
Glucose-Capillary: 125 mg/dL — ABNORMAL HIGH (ref 70–99)
Glucose-Capillary: 126 mg/dL — ABNORMAL HIGH (ref 70–99)
Glucose-Capillary: 129 mg/dL — ABNORMAL HIGH (ref 70–99)
Glucose-Capillary: 131 mg/dL — ABNORMAL HIGH (ref 70–99)
Glucose-Capillary: 132 mg/dL — ABNORMAL HIGH (ref 70–99)
Glucose-Capillary: 148 mg/dL — ABNORMAL HIGH (ref 70–99)
Glucose-Capillary: 150 mg/dL — ABNORMAL HIGH (ref 70–99)

## 2020-12-09 LAB — URINALYSIS, ROUTINE W REFLEX MICROSCOPIC
Bilirubin Urine: NEGATIVE
Glucose, UA: NEGATIVE mg/dL
Hgb urine dipstick: NEGATIVE
Ketones, ur: NEGATIVE mg/dL
Leukocytes,Ua: NEGATIVE
Nitrite: NEGATIVE
Protein, ur: NEGATIVE mg/dL
Specific Gravity, Urine: 1.006 (ref 1.005–1.030)
pH: 5 (ref 5.0–8.0)

## 2020-12-09 LAB — POCT I-STAT 7, (LYTES, BLD GAS, ICA,H+H)
Acid-Base Excess: 2 mmol/L (ref 0.0–2.0)
Bicarbonate: 26 mmol/L (ref 20.0–28.0)
Calcium, Ion: 1.24 mmol/L (ref 1.15–1.40)
HCT: 25 % — ABNORMAL LOW (ref 39.0–52.0)
Hemoglobin: 8.5 g/dL — ABNORMAL LOW (ref 13.0–17.0)
O2 Saturation: 98 %
Patient temperature: 99.5
Potassium: 4.2 mmol/L (ref 3.5–5.1)
Sodium: 139 mmol/L (ref 135–145)
TCO2: 27 mmol/L (ref 22–32)
pCO2 arterial: 36 mmHg (ref 32.0–48.0)
pH, Arterial: 7.469 — ABNORMAL HIGH (ref 7.350–7.450)
pO2, Arterial: 96 mmHg (ref 83.0–108.0)

## 2020-12-09 LAB — BASIC METABOLIC PANEL
Anion gap: 5 (ref 5–15)
BUN: 21 mg/dL — ABNORMAL HIGH (ref 6–20)
CO2: 27 mmol/L (ref 22–32)
Calcium: 8.5 mg/dL — ABNORMAL LOW (ref 8.9–10.3)
Chloride: 104 mmol/L (ref 98–111)
Creatinine, Ser: 0.89 mg/dL (ref 0.61–1.24)
GFR, Estimated: >60 mL/min (ref >60)
Glucose, Bld: 171 mg/dL — ABNORMAL HIGH (ref 70–99)
Potassium: 4 mmol/L (ref 3.5–5.1)
Sodium: 136 mmol/L (ref 135–145)

## 2020-12-09 MED ORDER — FENTANYL CITRATE (PF) 100 MCG/2ML IJ SOLN
50.0000 ug | INTRAMUSCULAR | Status: DC | PRN
  Administered 2020-12-09 – 2020-12-10 (×4): 50 ug via INTRAVENOUS
  Filled 2020-12-09 (×4): qty 2

## 2020-12-09 MED ORDER — PANTOPRAZOLE SODIUM 40 MG IV SOLR
40.0000 mg | INTRAVENOUS | Status: DC
  Administered 2020-12-09 – 2020-12-14 (×6): 40 mg via INTRAVENOUS
  Filled 2020-12-09 (×6): qty 40

## 2020-12-09 MED ORDER — ACETAMINOPHEN 500 MG PO TABS
1000.0000 mg | ORAL_TABLET | Freq: Four times a day (QID) | ORAL | Status: DC
  Administered 2020-12-09 – 2020-12-15 (×23): 1000 mg via ORAL
  Filled 2020-12-09 (×27): qty 2

## 2020-12-09 MED ORDER — HYDROCHLOROTHIAZIDE 25 MG PO TABS
25.0000 mg | ORAL_TABLET | Freq: Every day | ORAL | Status: DC
  Administered 2020-12-09 – 2020-12-10 (×2): 25 mg via ORAL
  Filled 2020-12-09: qty 1

## 2020-12-09 MED ORDER — LORAZEPAM 2 MG/ML IJ SOLN
INTRAMUSCULAR | Status: AC
  Administered 2020-12-09: 0.5 mg via INTRAVENOUS
  Filled 2020-12-09: qty 1

## 2020-12-09 MED ORDER — BOOST / RESOURCE BREEZE PO LIQD CUSTOM
1.0000 | Freq: Three times a day (TID) | ORAL | Status: DC
  Administered 2020-12-09 – 2020-12-10 (×3): 1 via ORAL
  Administered 2020-12-11: 237 mL via ORAL
  Administered 2020-12-11 – 2020-12-12 (×3): 1 via ORAL

## 2020-12-09 MED ORDER — DIPHENHYDRAMINE HCL 50 MG/ML IJ SOLN
25.0000 mg | Freq: Every evening | INTRAMUSCULAR | Status: DC | PRN
  Administered 2020-12-09: 25 mg via INTRAVENOUS
  Filled 2020-12-09: qty 1

## 2020-12-09 MED ORDER — SODIUM CHLORIDE 0.9 % IV SOLN
100.0000 mg | INTRAVENOUS | Status: DC
  Filled 2020-12-09: qty 100

## 2020-12-09 MED ORDER — LORAZEPAM 2 MG/ML IJ SOLN
0.5000 mg | Freq: Once | INTRAMUSCULAR | Status: AC
  Administered 2020-12-09: 0.5 mg via INTRAVENOUS
  Filled 2020-12-09: qty 1

## 2020-12-09 MED ORDER — DOCUSATE SODIUM 100 MG PO CAPS
100.0000 mg | ORAL_CAPSULE | Freq: Two times a day (BID) | ORAL | Status: DC
  Administered 2020-12-09 – 2020-12-15 (×13): 100 mg via ORAL
  Filled 2020-12-09 (×13): qty 1

## 2020-12-09 MED ORDER — LORAZEPAM 2 MG/ML IJ SOLN
1.0000 mg | INTRAMUSCULAR | Status: DC | PRN
  Administered 2020-12-09: 1 mg via INTRAVENOUS
  Filled 2020-12-09: qty 1

## 2020-12-09 MED ORDER — FENTANYL CITRATE (PF) 100 MCG/2ML IJ SOLN
25.0000 ug | INTRAMUSCULAR | Status: DC | PRN
  Administered 2020-12-09: 50 ug via INTRAVENOUS
  Filled 2020-12-09: qty 2

## 2020-12-09 MED ORDER — TRACE MINERALS CU-MN-SE-ZN 300-55-60-3000 MCG/ML IV SOLN
INTRAVENOUS | Status: AC
  Filled 2020-12-09: qty 1137.6

## 2020-12-09 MED ORDER — LINEZOLID 600 MG/300ML IV SOLN
600.0000 mg | Freq: Two times a day (BID) | INTRAVENOUS | Status: DC
  Administered 2020-12-09 – 2020-12-14 (×10): 600 mg via INTRAVENOUS
  Filled 2020-12-09 (×13): qty 300

## 2020-12-09 MED ORDER — LORAZEPAM 2 MG/ML IJ SOLN: 0.5000 mg | Freq: Once | INTRAMUSCULAR | Status: AC

## 2020-12-09 MED ORDER — OXYCODONE HCL 5 MG PO TABS
5.0000 mg | ORAL_TABLET | ORAL | Status: DC | PRN
  Administered 2020-12-09 (×2): 5 mg via ORAL
  Administered 2020-12-09 – 2020-12-12 (×11): 10 mg via ORAL
  Administered 2020-12-12: 5 mg via ORAL
  Administered 2020-12-12 – 2020-12-15 (×7): 10 mg via ORAL
  Administered 2020-12-15: 5 mg via ORAL
  Filled 2020-12-09 (×13): qty 2
  Filled 2020-12-09: qty 1
  Filled 2020-12-09 (×4): qty 2
  Filled 2020-12-09: qty 1
  Filled 2020-12-09 (×2): qty 2
  Filled 2020-12-09: qty 1
  Filled 2020-12-09: qty 2

## 2020-12-09 MED ORDER — POLYETHYLENE GLYCOL 3350 17 G PO PACK
17.0000 g | PACK | Freq: Every day | ORAL | Status: DC
  Administered 2020-12-09 – 2020-12-15 (×7): 17 g via ORAL
  Filled 2020-12-09 (×6): qty 1

## 2020-12-09 MED ORDER — ALUM & MAG HYDROXIDE-SIMETH 200-200-20 MG/5ML PO SUSP
15.0000 mL | Freq: Four times a day (QID) | ORAL | Status: DC | PRN
Start: 2020-12-09 — End: 2020-12-16

## 2020-12-09 MED ORDER — METHOCARBAMOL 500 MG PO TABS
1000.0000 mg | ORAL_TABLET | Freq: Three times a day (TID) | ORAL | Status: DC
  Administered 2020-12-09 – 2020-12-15 (×20): 1000 mg via ORAL
  Filled 2020-12-09 (×19): qty 2

## 2020-12-09 MED ORDER — BUSPIRONE HCL 5 MG PO TABS
10.0000 mg | ORAL_TABLET | Freq: Three times a day (TID) | ORAL | Status: DC
  Administered 2020-12-09 – 2020-12-15 (×18): 10 mg via ORAL
  Filled 2020-12-09 (×3): qty 2
  Filled 2020-12-09 (×2): qty 1
  Filled 2020-12-09: qty 2
  Filled 2020-12-09 (×2): qty 1
  Filled 2020-12-09 (×7): qty 2
  Filled 2020-12-09 (×2): qty 1
  Filled 2020-12-09: qty 2
  Filled 2020-12-09: qty 1
  Filled 2020-12-09: qty 2

## 2020-12-09 MED ORDER — BUSPIRONE HCL 10 MG PO TABS: 10.0000 mg | ORAL_TABLET | Freq: Three times a day (TID) | ORAL | Status: DC

## 2020-12-09 MED ORDER — SODIUM CHLORIDE 0.9 % IV SOLN
200.0000 mg | Freq: Once | INTRAVENOUS | Status: AC
  Administered 2020-12-09: 200 mg via INTRAVENOUS
  Filled 2020-12-09: qty 200

## 2020-12-09 NOTE — Progress Notes (Signed)
Trauma Response Nurse Note-  Reason for Call / Reason for Trauma activation:   - TRN noted pt was breathing fast. Primary RN contacted and TRN went up and saw patient  Initial Focused Assessment (If applicable, or please see trauma documentation):  - Pt noted to be tachynic in the 40s. TRN auscultated clear upper lobes and diminished lower lobes. Pt unable to finish a sentence without stopping to take breaths. Pt does say he gets short of breath when speaking. TRN notified Dr. Andrey Campanile  Interventions:  - Orders obtained for PRN fentanyl and NPO status to be NPO except meds/ice chips RT came and assessed patient as well.  Plan of Care as of this note:  -Pain control and observation  Event Summary:   -TRN noted an elevated MEWS and noted that patient was tacypnic. Primary RN contacted and stated pt was breathing as fast as is documented (40s-50s please see chart vitals for exact numbers.) RT came and assessed patient and TRN talked with Dr. Andrey Campanile on the phone. Dr. Andrey Campanile stated we could try CPAP but RT stated he does not believe that would work. TRN obtained orders for fentanyl PRN and NPO to be NPO except medications and ice chips. Provider also notified of BP of 189/102. Primary RN stated she just gave patient some BP medication (0630). TRN asked primary RN to relay all this to the next shift RN and she agreed to do so.   The Following (if applicable):    -MD notified: Dr. Andrey Campanile at [facility]

## 2020-12-09 NOTE — Progress Notes (Signed)
Physical Therapy Treatment Patient Details Name: Jesse Waters MRN: [id number] DOB: 07-25-1995 Today's Date: 12/09/2020    History of Present Illness 25 y.o. 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 Abdominal closure with incisional vac changed 7/3 ostomy with Abdominal binder sepsis extubated 7/4. Re-intubated 7/5. PMH includes:  HTN, PNA    PT Comments    Pt is demonstrating improved aerobic endurance, activity tolerance, and strength through being able to transfer to stand 3x and maintain static standing for < ~10 sec each bout with modA+2 while on RA and maintaining SpO2 >/= 96% this date. Pt is very motivated to participate and improve, desiring to progress to ambulating soon. Pt continues to be at risk for falls though. Will continue to follow acutely. Current recommendations remain appropriate.   Follow Up Recommendations  CIR     Equipment Recommendations   (TBA)    Recommendations for Other Services       Precautions / Restrictions Precautions Precautions: Fall Precaution Comments: jp drain L side, wound vac to abdomen, gravity dependent drain R side Restrictions Weight Bearing Restrictions: No    Mobility  Bed Mobility Overal bed mobility: Needs Assistance Bed Mobility: Supine to Sit     Supine to sit: HOB elevated;Min guard     General bed mobility comments: Bed placed in egress position, cuing pt to pull up with hands on bil hand rails to ascend trunk from max elevated HOB, min guard for safety.    Transfers Overall transfer level: Needs assistance Equipment used: 2 person hand held assist Transfers: Sit to/from Stand Sit to Stand: Mod assist;+2 physical assistance;+2 safety/equipment         General transfer comment: Pt needing increased time to prepare to transfer to stand from end of bed with bed in egress position. Sit to stand  with bil HHA, pushing up from bed rails, modAx2. Cues to extend hips and bring chest superiorly to improve posture, but limited by abdominal pain/tightness. x3 bouts  Ambulation/Gait             General Gait Details: Unable this date.   Stairs             Wheelchair Mobility    Modified Rankin (Stroke Patients Only)       Balance Overall balance assessment: Needs assistance Sitting-balance support: Bilateral upper extremity supported;Feet supported Sitting balance-Leahy Scale: Poor Sitting balance - Comments: Pt needing UE support for sitting upright without back support from bed in egress position.   Standing balance support: Bilateral upper extremity supported Standing balance-Leahy Scale: Poor Standing balance comment: Reliant on UE support and external physical assistance to stand x3 bouts lasting < 10 sec each.                            Cognition Arousal/Alertness: Awake/alert Behavior During Therapy: Flat affect Overall Cognitive Status: Within Functional Limits for tasks assessed                                 General Comments: Pt with flat affect, but likely appropriate considering pain etc. Pt following mulit-step commands but needing increased time, likely due to pain.      Exercises General Exercises - Lower Extremity Long Arc Quad:  (5 reps L, 10 reps R)    General Comments  General comments (skin integrity, edema, etc.): Upon entry, RR up to low 50s at rest but stayed 40s or less with mobility; HR up to 110s; SpO2 >/= 96% on RA during mobility, re-donned [place] at 3L end of session      Pertinent Vitals/Pain Pain Assessment: Faces Faces Pain Scale: Hurts even more Pain Location: abdomen Pain Descriptors / Indicators: Discomfort;Guarding;Grimacing;Tightness Pain Intervention(s): Monitored during session;Limited activity within patient's tolerance;Repositioned    Home Living                      Prior Function             PT Goals (current goals can now be found in the care plan section) Acute Rehab PT Goals Patient Stated Goal: to walk PT Goal Formulation: With patient Time For Goal Achievement: 12/19/20 Potential to Achieve Goals: Fair Progress towards PT goals: Progressing toward goals    Frequency    Min 3X/week      PT Plan Current plan remains appropriate    Co-evaluation PT/OT/SLP Co-Evaluation/Treatment: Yes Reason for Co-Treatment: For patient/therapist safety;To address functional/ADL transfers PT goals addressed during session: Mobility/safety with mobility;Balance        AM-PAC PT "6 Clicks" Mobility   Outcome Measure  Help needed turning from your back to your side while in a flat bed without using bedrails?: A Lot Help needed moving from lying on your back to sitting on the side of a flat bed without using bedrails?: A Lot Help needed moving to and from a bed to a chair (including a wheelchair)?: Total Help needed standing up from a chair using your arms (e.g., wheelchair or bedside chair)?: Total Help needed to walk in hospital room?: Total Help needed climbing 3-5 steps with a railing? : Total 6 Click Score: 8    End of Session Equipment Utilized During Treatment: Oxygen Activity Tolerance: Patient limited by fatigue;Patient limited by pain Patient left: in bed;with call bell/phone within reach;with SCD's reapplied;with family/visitor present Nurse Communication: Mobility status;Other (comment) (no bed alarm on) PT Visit Diagnosis: Muscle weakness (generalized) (M62.81);Difficulty in walking, not elsewhere classified (R26.2);Pain;Unsteadiness on feet (R26.81);Other abnormalities of gait and mobility (R26.89)     Time: 7414-2395 PT Time Calculation (min) (ACUTE ONLY): 28 min  Charges:  $Therapeutic Activity: 8-22 mins                     Raymond Gurney, PT, DPT Acute Rehabilitation Services  Pager: [phone] Office: [phone]    Jewel Baize 12/09/2020, 12:23 PM

## 2020-12-09 NOTE — Progress Notes (Signed)
Rehab Admissions Coordinator Note:  Patient was screened by Clois Dupes for appropriateness for an Inpatient Acute Rehab Consult per therapy recs.   At this time, we are recommending Inpatient Rehab consult. I will place order per protocol.  Clois Dupes RN MSN 12/09/2020, 2:38 PM  I can be reached at [phone].

## 2020-12-09 NOTE — Progress Notes (Signed)
   12/09/20 0030  Provider Notification  Provider Name/Title Dr Andrey Campanile  Date Provider Notified 12/09/20  Time Provider Notified 0030  Notification Type Call  Notification Reason Other (Comment) (Patient is breathing 50 times a minute, he is hypertensive even with use of prns and tachycardic. Patient is also sweaty and clammy, compaining of severe pain in his lower stomach.)  Provider response See new orders  Date of Provider Response 12/09/20  Time of Provider Response 0030

## 2020-12-09 NOTE — Progress Notes (Signed)
Discussed plan of care with Dr. Bedelia Person. Orders changed to reflect plan and received instructions from Dr. Bedelia Person to keep foley catheter until Saturday 7/8. We will trial foley out again at that time.
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
## 2020-12-09 NOTE — Progress Notes (Signed)
Occupational Therapy Treatment Patient Details Name: Jesse Waters MRN: [id number] DOB: Nov 02, 1995 Today's Date: 12/09/2020    History of present illness 25 y.o. 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 Abdominal closure with incisional vac changed 7/3 ostomy with Abdominal binder sepsis extubated 7/4. Re-intubated 7/5. PMH includes:  HTN, PNA   OT comments  This 25 yo male admitted with above presents to acute OT today focusing on sit<>stand from egress position of bed as precursor to toilet transfers and ambulation for ADLs. He was able to stand x3 (less than 10 seconds per stand due to increased RR). He will continue to benefit from acute OT with follow up on CIR.  Follow Up Recommendations  CIR    Equipment Recommendations  Other (comment) (TBD next venue)    Recommendations for Other Services Rehab consult    Precautions / Restrictions Precautions Precautions: Fall Precaution Comments: jp drain L side, wound vac to abdomen, gravity dependent drain R side Restrictions Weight Bearing Restrictions: No       Mobility Bed Mobility Overal bed mobility: Needs Assistance Bed Mobility: Supine to Sit     Supine to sit: HOB elevated;Min guard     General bed mobility comments: Bed placed in egress position, cuing pt to pull up with hands on bil hand rails to ascend trunk from max elevated HOB, min guard for safety.    Transfers Overall transfer level: Needs assistance Equipment used: 2 person hand held assist Transfers: Sit to/from Stand Sit to Stand: Mod assist;+2 physical assistance;+2 safety/equipment         General transfer comment: Pt needing increased time to prepare to transfer to stand from end of bed with bed in egress position. Sit to stand with bil HHA, pushing up from bed rails, modAx2. Cues to extend hips and bring chest superiorly to  improve posture, but limited by abdominal pain/tightness. x3 bouts    Balance Overall balance assessment: Needs assistance Sitting-balance support: Bilateral upper extremity supported;Feet supported Sitting balance-Leahy Scale: Poor Sitting balance - Comments: Pt needing UE support for sitting upright without back support from bed in egress position.   Standing balance support: Bilateral upper extremity supported Standing balance-Leahy Scale: Poor Standing balance comment: Reliant on UE support and external physical assistance to stand x3 bouts lasting < 10 sec each.                           ADL either performed or assessed with clinical judgement     Vision Baseline Vision/History: No visual deficits Patient Visual Report: No change from baseline            Cognition Arousal/Alertness: Awake/alert Behavior During Therapy: Flat affect Overall Cognitive Status: Within Functional Limits for tasks assessed                                 General Comments: Pt with flat affect, but likely appropriate considering pain etc. Pt following mulit-step commands but needing increased time, likely due to pain.        Exercises General Exercises - Lower Extremity Long Arc Quad:  (5 reps L, 10 reps R)      General Comments Upon entry, RR up to low 50s at rest but stayed 40s or less with mobility; HR up to 110s; SpO2 >/= 96% on  RA during mobility, re-donned [place] at 3L end of session    Pertinent Vitals/ Pain       Pain Assessment: Faces Faces Pain Scale: Hurts even more Pain Location: abdomen Pain Descriptors / Indicators: Discomfort;Guarding;Grimacing;Tightness Pain Intervention(s): Monitored during session;Limited activity within patient's tolerance;Repositioned         Frequency  Min 2X/week        Progress Toward Goals  OT Goals(current goals can now be found in the care plan section)  Progress towards OT goals: Progressing toward goals  Acute  Rehab OT Goals Patient Stated Goal: to walk OT Goal Formulation: With patient Time For Goal Achievement: 12/19/20 Potential to Achieve Goals: Good  Plan Discharge plan remains appropriate    Co-evaluation    PT/OT/SLP Co-Evaluation/Treatment: Yes Reason for Co-Treatment: For patient/therapist safety PT goals addressed during session: Mobility/safety with mobility;Balance OT goals addressed during session: Strengthening/ROM      AM-PAC OT "6 Clicks" Daily Activity     Outcome Measure   Help from another person eating meals?: None Help from another person taking care of personal grooming?: A Little Help from another person toileting, which includes using toliet, bedpan, or urinal?: A Lot Help from another person bathing (including washing, rinsing, drying)?: A Lot Help from another person to put on and taking off regular upper body clothing?: A Lot Help from another person to put on and taking off regular lower body clothing?: Total 6 Click Score: 14    End of Session    OT Visit Diagnosis: Unsteadiness on feet (R26.81);Muscle weakness (generalized) (M62.81);Pain Pain - part of body:  (abdomen)   Activity Tolerance Patient tolerated treatment well   Patient Left in bed;with call bell/phone within reach (wound care RN getting ready to change vac)   Nurse Communication  (RN in came in and saw how well pt stood with [place])        Time: 9450-3888 OT Time Calculation (min): 28 min  Charges: OT General Charges $OT Visit: 1 Visit OT Treatments $Self Care/Home Management : 8-22 mins  Ignacia Palma, OTR/L Acute [employer] Pager [phone] Office [phone]     Evette Georges 12/09/2020, 1:49 PM

## 2020-12-09 NOTE — Progress Notes (Signed)
RN unit noticed picc catheter was slightly pulled out and called IV Vast team to check PICC catheter. Noted 3 cms of the catheter was out. Checked ports, flushes well with good blood return. Dressing changed. RN unit notified to get an xray order from MD to check placement of the picc tip catheter.
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
## 2020-12-09 NOTE — Progress Notes (Signed)
RT called to assess pt. Due to respiratory rate increase and patient sweating. RT assessed pt. And found no signs of distress. Pt. RR is fluctuating between 17 and 40. Pt. States he is in lots of pain and cramping in his belly. Pt. Sats are stable at 96%. RT does not see the need for NIV at the moment. RN states plan for now is to give pain meds and try to control pt. BP. RN made aware to notify RT if anything changes.

## 2020-12-09 NOTE — Consult Note (Addendum)
WOC Nurse wound follow up Patient receiving care in [facility] 4N25 Wound type: Surgical Wound bed: Beefy red Drainage (amount, consistency, odor) Tan purulent drainage in canister. New canister applied today. Periwound: Scattered psoriasis throughout the abdomen.  Dressing procedure/placement/frequency: 3 pieces of black foam dressing removed slowly with NS to wet the foam. The surrounding skin was cleaned with 48M skin barrier wipes. The upper area of skin on the abdomen and sides next to the wound were draped with 5 Mepilex border foam dressings size 5 x 12.5 cm to help the drape to stay in place. 1 piece of black foam placed into the upper wound, 1 piece placed into the lower wound. A small piece of drape was placed over the skin that connects the 2 wounds, then 1 small piece of black foam to bridge the 2 wounds together. Drape applied, immediate suction obtained at 125 mm Hg.  WOC Nurse ostomy follow up Stoma type/location:  LLQ colostomy Stomal assessment/size: 1 3/4" budded moist and pink Peristomal assessment: intact Treatment options for stomal/peristomal skin:  48M skin barrier wipes used [employer] [place] # [phone]) Output; Mushy green/brown Ostomy pouching: 2pc. 2 1/4 used for smaller skin barrier next to the wound. Education provided: None Enrolled patient in [place] Secure Start Discharge program: No  Pouch not changed today but was emptied. Patient is independent with pouch changes however may need assistance due to the stoma being so close to the wound.    WOC is available M-F 8:00-3:00. Please page if needed. WOC will FU on M W F.   Renaldo Reel Katrinka Blazing, MSN, RN, CMSRN, Angus Seller, [facility] Wound Treatment Associate Pager [phone]
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
## 2020-12-10 LAB — CBC
HCT: 26.9 % — ABNORMAL LOW (ref 39.0–52.0)
Hemoglobin: 8.6 g/dL — ABNORMAL LOW (ref 13.0–17.0)
MCH: 28.3 pg (ref 26.0–34.0)
MCHC: 32 g/dL (ref 30.0–36.0)
MCV: 88.5 fL (ref 80.0–100.0)
Platelets: 673 10*3/uL — ABNORMAL HIGH (ref 150–400)
RBC: 3.04 MIL/uL — ABNORMAL LOW (ref 4.22–5.81)
RDW: 17.2 % — ABNORMAL HIGH (ref 11.5–15.5)
WBC: 16 10*3/uL — ABNORMAL HIGH (ref 4.0–10.5)
nRBC: 0.1 % (ref 0.0–0.2)

## 2020-12-10 LAB — GLUCOSE, CAPILLARY
Glucose-Capillary: 124 mg/dL — ABNORMAL HIGH (ref 70–99)
Glucose-Capillary: 138 mg/dL — ABNORMAL HIGH (ref 70–99)
Glucose-Capillary: 121 mg/dL — ABNORMAL HIGH (ref 70–99)
Glucose-Capillary: 135 mg/dL — ABNORMAL HIGH (ref 70–99)
Glucose-Capillary: 129 mg/dL — ABNORMAL HIGH (ref 70–99)

## 2020-12-10 LAB — BASIC METABOLIC PANEL
Anion gap: 4 — ABNORMAL LOW (ref 5–15)
BUN: 17 mg/dL (ref 6–20)
CO2: 21 mmol/L — ABNORMAL LOW (ref 22–32)
Calcium: 6.4 mg/dL — CL (ref 8.9–10.3)
Chloride: 116 mmol/L — ABNORMAL HIGH (ref 98–111)
Creatinine, Ser: 0.61 mg/dL (ref 0.61–1.24)
GFR, Estimated: >60 mL/min (ref >60)
Glucose, Bld: 85 mg/dL (ref 70–99)
Potassium: 3.2 mmol/L — ABNORMAL LOW (ref 3.5–5.1)
Sodium: 141 mmol/L (ref 135–145)

## 2020-12-10 LAB — MAGNESIUM: Magnesium: 1.4 mg/dL — ABNORMAL LOW (ref 1.7–2.4)

## 2020-12-10 LAB — PHOSPHORUS: Phosphorus: 3.6 mg/dL (ref 2.5–4.6)

## 2020-12-10 LAB — TRIGLYCERIDES: Triglycerides: 116 mg/dL (ref <150)

## 2020-12-10 MED ORDER — POTASSIUM CHLORIDE 10 MEQ/50ML IV SOLN
10.0000 meq | INTRAVENOUS | Status: AC
  Administered 2020-12-10 (×5): 10 meq via INTRAVENOUS
  Filled 2020-12-10 (×6): qty 50

## 2020-12-10 MED ORDER — LORAZEPAM 2 MG/ML IJ SOLN
0.5000 mg | Freq: Four times a day (QID) | INTRAMUSCULAR | Status: DC | PRN
  Administered 2020-12-10: 0.5 mg via INTRAVENOUS
  Filled 2020-12-10 (×2): qty 1

## 2020-12-10 MED ORDER — CALCIUM GLUCONATE-NACL 2-0.675 GM/100ML-% IV SOLN
2.0000 g | Freq: Once | INTRAVENOUS | Status: AC
  Administered 2020-12-10: 2000 mg via INTRAVENOUS
  Filled 2020-12-10: qty 100

## 2020-12-10 MED ORDER — CALCIUM GLUCONATE-NACL 1-0.675 GM/50ML-% IV SOLN
INTRAVENOUS | Status: AC
  Filled 2020-12-10: qty 50

## 2020-12-10 MED ORDER — FENTANYL CITRATE (PF) 100 MCG/2ML IJ SOLN
50.0000 ug | INTRAMUSCULAR | Status: DC | PRN
  Administered 2020-12-10 – 2020-12-14 (×6): 50 ug via INTRAVENOUS
  Filled 2020-12-10 (×8): qty 2

## 2020-12-10 MED ORDER — MAGNESIUM SULFATE 2 GM/50ML IV SOLN
2.0000 g | Freq: Once | INTRAVENOUS | Status: AC
  Administered 2020-12-10: 2 g via INTRAVENOUS
  Filled 2020-12-10: qty 50

## 2020-12-10 MED ORDER — CHROMIC CHLORIDE 40 MCG/10ML IV SOLN
INTRAVENOUS | Status: DC
  Filled 2020-12-10: qty 1137.6

## 2020-12-10 MED ORDER — IBUPROFEN 200 MG PO TABS
800.0000 mg | ORAL_TABLET | Freq: Three times a day (TID) | ORAL | Status: DC
  Administered 2020-12-10 – 2020-12-15 (×15): 800 mg via ORAL
  Filled 2020-12-10 (×16): qty 4

## 2020-12-10 MED ORDER — FUROSEMIDE 10 MG/ML IJ SOLN
40.0000 mg | Freq: Once | INTRAMUSCULAR | Status: AC
  Administered 2020-12-10: 40 mg via INTRAVENOUS
  Filled 2020-12-10: qty 4

## 2020-12-10 MED ORDER — CALCIUM FOR TPN
INJECTION | INTRAVENOUS | Status: AC
Start: 2020-12-10 — End: 2020-12-11
  Filled 2020-12-10: qty 568.8

## 2020-12-10 MED ORDER — FLUCONAZOLE IN SODIUM CHLORIDE 400-0.9 MG/200ML-% IV SOLN
400.0000 mg | INTRAVENOUS | Status: DC
  Administered 2020-12-10 – 2020-12-14 (×4): 400 mg via INTRAVENOUS
  Filled 2020-12-10 (×5): qty 200

## 2020-12-10 MED ORDER — TRAZODONE HCL 50 MG PO TABS
50.0000 mg | ORAL_TABLET | Freq: Every evening | ORAL | Status: DC | PRN
  Administered 2020-12-11 – 2020-12-14 (×5): 50 mg via ORAL
  Filled 2020-12-10 (×5): qty 1
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
## 2020-12-10 NOTE — Progress Notes (Signed)
Date and time results received: 12/10/20 5:45 AM   Critical Value: Calcium 6.4  Name of Provider Notified: Dr. Bedelia Person  Orders Received? Or Actions Taken?:  awaiting orders
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
## 2020-12-10 NOTE — Progress Notes (Signed)
Trauma/Critical Care Follow Up Note  Subjective:    Overnight Issues:   Objective:  Vital signs for last 24 hours: Temp:  [98.2 F (36.8 C)-99.4 F (37.4 C)] 98.2 F (36.8 C) (07/09 0800) Pulse Rate:  [97-139] 97 (07/09 0600) Resp:  [14-54] 30 (07/09 0600) BP: (117-197)/(59-137) 197/137 (07/09 0600) SpO2:  [93 %-100 %] 93 % (07/09 0600)  Hemodynamic parameters for last 24 hours:    Intake/Output from previous day: 07/08 0701 - 07/09 0700 In: 3851.5 [P.O.:520; I.V.:2428.7; IV Piggyback:887.8] Out: 4415 [Urine:3305; Drains:185; Stool:925]  Intake/Output this shift: No intake/output data recorded.  Vent settings for last 24 hours:    Physical Exam:  Gen: comfortable, no distress Neuro: non-focal exam HEENT: PERRL Neck: supple CV: RRR Pulm: unlabored breathing Abd: soft, appropriately TTP, vac and drains in place, R drain clearing up, L drain appears more feculent, vac o/p clearing up but still purulent GU: clear yellow urine Extr: wwp, 1+edema   Results for orders placed or performed during the hospital encounter of 11/25/20 (from the past 24 hour(s))  Basic metabolic panel     Status: Abnormal   Collection Time: 12/09/20  9:50 AM  Result Value Ref Range   Sodium 136 135 - 145 mmol/L   Potassium 4.0 3.5 - 5.1 mmol/L   Chloride 104 98 - 111 mmol/L   CO2 27 22 - 32 mmol/L   Glucose, Bld 171 (H) 70 - 99 mg/dL   BUN 21 (H) 6 - 20 mg/dL   Creatinine, Ser 0.27 0.61 - 1.24 mg/dL   Calcium 8.5 (L) 8.9 - 10.3 mg/dL   GFR, Estimated >25 >36 mL/min   Anion gap 5 5 - 15  Triglycerides     Status: Abnormal   Collection Time: 12/09/20  9:50 AM  Result Value Ref Range   Triglycerides 334 (H) <150 mg/dL  CBC with Differential/Platelet     Status: Abnormal   Collection Time: 12/09/20 11:25 AM  Result Value Ref Range   WBC 17.7 (H) 4.0 - 10.5 K/uL   RBC 3.09 (L) 4.22 - 5.81 MIL/uL   Hemoglobin 8.6 (L) 13.0 - 17.0 g/dL   HCT 64.4 (L) 03.4 - 74.2 %   MCV 88.0 80.0 -  100.0 fL   MCH 27.8 26.0 - 34.0 pg   MCHC 31.6 30.0 - 36.0 g/dL   RDW 59.5 (H) 63.8 - 75.6 %   Platelets 633 (H) 150 - 400 K/uL   nRBC 0.0 0.0 - 0.2 %   Neutrophils Relative % 78 %   Neutro Abs 13.8 (H) 1.7 - 7.7 K/uL   Lymphocytes Relative 12 %   Lymphs Abs 2.1 0.7 - 4.0 K/uL   Monocytes Relative 6 %   Monocytes Absolute 1.1 (H) 0.1 - 1.0 K/uL   Eosinophils Relative 1 %   Eosinophils Absolute 0.3 0.0 - 0.5 K/uL   Basophils Relative 0 %   Basophils Absolute 0.0 0.0 - 0.1 K/uL   Immature Granulocytes 3 %   Abs Immature Granulocytes 0.46 (H) 0.00 - 0.07 K/uL  Glucose, capillary     Status: Abnormal   Collection Time: 12/09/20 11:26 AM  Result Value Ref Range   Glucose-Capillary 148 (H) 70 - 99 mg/dL  Urinalysis, Routine w reflex microscopic Urine, Catheterized     Status: Abnormal   Collection Time: 12/09/20  3:17 PM  Result Value Ref Range   Color, Urine STRAW (A) YELLOW   APPearance CLEAR CLEAR   Specific Gravity, Urine 1.006 1.005 - 1.030  pH 5.0 5.0 - 8.0   Glucose, UA NEGATIVE NEGATIVE mg/dL   Hgb urine dipstick NEGATIVE NEGATIVE   Bilirubin Urine NEGATIVE NEGATIVE   Ketones, ur NEGATIVE NEGATIVE mg/dL   Protein, ur NEGATIVE NEGATIVE mg/dL   Nitrite NEGATIVE NEGATIVE   Leukocytes,Ua NEGATIVE NEGATIVE  Glucose, capillary     Status: Abnormal   Collection Time: 12/09/20  3:27 PM  Result Value Ref Range   Glucose-Capillary 150 (H) 70 - 99 mg/dL  Culture, blood (routine x 2)     Status: None (Preliminary result)   Collection Time: 12/09/20  4:58 PM   Specimen: BLOOD  Result Value Ref Range   Specimen Description BLOOD RIGHT ANTECUBITAL    Special Requests      BOTTLES DRAWN AEROBIC AND ANAEROBIC Blood Culture adequate volume   Culture      NO GROWTH < 24 HOURS Performed at [facility], [address] [place], [place] [address]    Report Status PENDING   Culture, blood (routine x 2)     Status: None (Preliminary result)   Collection Time: 12/09/20  5:02 PM    Specimen: BLOOD  Result Value Ref Range   Specimen Description BLOOD RIGHT ANTECUBITAL    Special Requests      BOTTLES DRAWN AEROBIC AND ANAEROBIC Blood Culture adequate volume   Culture      NO GROWTH < 24 HOURS Performed at [facility], [address] [place], [place] [address]    Report Status PENDING   Glucose, capillary     Status: Abnormal   Collection Time: 12/09/20  7:46 PM  Result Value Ref Range   Glucose-Capillary 126 (H) 70 - 99 mg/dL   Comment 1 Notify RN    Comment 2 Document in Chart   Glucose, capillary     Status: Abnormal   Collection Time: 12/09/20 11:13 PM  Result Value Ref Range   Glucose-Capillary 129 (H) 70 - 99 mg/dL   Comment 1 Notify RN    Comment 2 Document in Chart   Glucose, capillary     Status: Abnormal   Collection Time: 12/10/20  3:44 AM  Result Value Ref Range   Glucose-Capillary 124 (H) 70 - 99 mg/dL   Comment 1 Notify RN    Comment 2 Document in Chart   Basic metabolic panel     Status: Abnormal   Collection Time: 12/10/20  4:40 AM  Result Value Ref Range   Sodium 141 135 - 145 mmol/L   Potassium 3.2 (L) 3.5 - 5.1 mmol/L   Chloride 116 (H) 98 - 111 mmol/L   CO2 21 (L) 22 - 32 mmol/L   Glucose, Bld 85 70 - 99 mg/dL   BUN 17 6 - 20 mg/dL   Creatinine, Ser 9.41 0.61 - 1.24 mg/dL   Calcium 6.4 (LL) 8.9 - 10.3 mg/dL   GFR, Estimated >74 >08 mL/min   Anion gap 4 (L) 5 - 15  Magnesium     Status: Abnormal   Collection Time: 12/10/20  4:40 AM  Result Value Ref Range   Magnesium 1.4 (L) 1.7 - 2.4 mg/dL  Phosphorus     Status: None   Collection Time: 12/10/20  4:40 AM  Result Value Ref Range   Phosphorus 3.6 2.5 - 4.6 mg/dL  Triglycerides     Status: None   Collection Time: 12/10/20  4:40 AM  Result Value Ref Range   Triglycerides 116 <150 mg/dL  Glucose, capillary  Status: Abnormal   Collection Time: 12/10/20  7:53 AM  Result Value Ref Range   Glucose-Capillary 138 (H) 70 - 99 mg/dL    Assessment & Plan: The plan of  care was discussed with the bedside nurse for the day, who is in agreement with this plan and no additional concerns were raised.   Present on Admission:  History of colostomy reversal    LOS: 15 days   Additional comments:I reviewed the patient's new clinical lab test results.   and I reviewed the patients new imaging test results.    Remote h/o polytrauma with colostomy and parastomal hernia  - s/p exlap, colostomy reversal, and parastomal hernia repair. 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 Abdominal closure with incisional vac changed 7/3-60cc purulent o/p. Having ostomy o/p. Abdominal binder. Drains with purulent o/p-125cc VDRF - extubated 7/4. Reintubated 7/5. Extubated 7/7, tachypnea this AM, likely 2/2 inadequate pain control and situational anxiety Sepsis - intra-abdominal source, s/p 9d zosyn, zyvoxx stopped 7/7, restarted 7/8 due to pt with subjective fevers and WBC up. Bacteroides on cx. Repeat CT 7/5 with no residual fluid collection. Bcx sent 7/5, NGTD and again 7/8. Eraxis started 7/8. Will de-escalate eraxis to fluconazole today and plan for 5d course of  zosyn/zyvoxx/flucon starting 7/8, end 7/12 unless clinically worsens. Foley - removed, spont voiding FEN - PICC/TPN, adv to FLD, half TPN today, 40 of lasix today DVT - SCDs, LMWH, PPI Dispo - ICU, maybe 4NP 7/10  Diamantina Monks, MD Trauma & General Surgery Please use [email or web address] to contact on call provider  12/10/2020  *Care during the described time interval was provided by me. I have reviewed this patient's available data, including medical history, events of note, physical examination and test results as part of my evaluation.

## 2020-12-10 NOTE — Progress Notes (Signed)
Inpatient Rehab Admissions Coordinator:   I met with pt.  To discuss potential CIR admit. He states interest and his father states he can provide 24/7 support at discharge. I will check to be certain we have a contract with pt.'s insurer and open a case with them  on Monday if we do.   Clemens Catholic, [place], [place] Admissions Coordinator  [phone] ([place]) [phone] (office)

## 2020-12-11 LAB — CULTURE, RESPIRATORY W GRAM STAIN: Gram Stain: NONE SEEN

## 2020-12-11 LAB — CBC
HCT: 27.8 % — ABNORMAL LOW (ref 39.0–52.0)
Hemoglobin: 8.5 g/dL — ABNORMAL LOW (ref 13.0–17.0)
MCH: 27.8 pg (ref 26.0–34.0)
MCHC: 30.6 g/dL (ref 30.0–36.0)
MCV: 90.8 fL (ref 80.0–100.0)
Platelets: 718 10*3/uL — ABNORMAL HIGH (ref 150–400)
RBC: 3.06 MIL/uL — ABNORMAL LOW (ref 4.22–5.81)
RDW: 17.2 % — ABNORMAL HIGH (ref 11.5–15.5)
WBC: 16.4 10*3/uL — ABNORMAL HIGH (ref 4.0–10.5)
nRBC: 0 % (ref 0.0–0.2)

## 2020-12-11 LAB — BASIC METABOLIC PANEL
Anion gap: 8 (ref 5–15)
BUN: 18 mg/dL (ref 6–20)
CO2: 26 mmol/L (ref 22–32)
Calcium: 8.6 mg/dL — ABNORMAL LOW (ref 8.9–10.3)
Chloride: 104 mmol/L (ref 98–111)
Creatinine, Ser: 0.86 mg/dL (ref 0.61–1.24)
GFR, Estimated: >60 mL/min (ref >60)
Glucose, Bld: 109 mg/dL — ABNORMAL HIGH (ref 70–99)
Potassium: 4.4 mmol/L (ref 3.5–5.1)
Sodium: 138 mmol/L (ref 135–145)

## 2020-12-11 LAB — CULTURE, BLOOD (ROUTINE X 2)
Culture: NO GROWTH
Culture: NO GROWTH
Special Requests: ADEQUATE
Special Requests: ADEQUATE

## 2020-12-11 LAB — GLUCOSE, CAPILLARY
Glucose-Capillary: 108 mg/dL — ABNORMAL HIGH (ref 70–99)
Glucose-Capillary: 108 mg/dL — ABNORMAL HIGH (ref 70–99)
Glucose-Capillary: 111 mg/dL — ABNORMAL HIGH (ref 70–99)
Glucose-Capillary: 125 mg/dL — ABNORMAL HIGH (ref 70–99)
Glucose-Capillary: 98 mg/dL (ref 70–99)

## 2020-12-11 LAB — TRIGLYCERIDES: Triglycerides: 270 mg/dL — ABNORMAL HIGH (ref <150)

## 2020-12-11 LAB — MAGNESIUM: Magnesium: 2.1 mg/dL (ref 1.7–2.4)

## 2020-12-11 LAB — PHOSPHORUS: Phosphorus: 5.5 mg/dL — ABNORMAL HIGH (ref 2.5–4.6)

## 2020-12-11 MED ORDER — FUROSEMIDE 10 MG/ML IJ SOLN
40.0000 mg | Freq: Once | INTRAMUSCULAR | Status: AC
  Administered 2020-12-11: 40 mg via INTRAVENOUS
  Filled 2020-12-11: qty 4

## 2020-12-11 MED ORDER — LOSARTAN POTASSIUM 50 MG PO TABS
100.0000 mg | ORAL_TABLET | Freq: Every day | ORAL | Status: DC
  Administered 2020-12-11 – 2020-12-15 (×5): 100 mg via ORAL
  Filled 2020-12-11 (×5): qty 2

## 2020-12-11 MED ORDER — HYDROCHLOROTHIAZIDE 25 MG PO TABS
25.0000 mg | ORAL_TABLET | Freq: Every day | ORAL | Status: DC
  Administered 2020-12-11 – 2020-12-15 (×5): 25 mg via ORAL
  Filled 2020-12-11 (×5): qty 1

## 2020-12-11 MED ORDER — LOSARTAN POTASSIUM-HCTZ 100-25 MG PO TABS: 1.0000 | ORAL_TABLET | Freq: Every day | ORAL | Status: DC

## 2020-12-11 NOTE — Progress Notes (Signed)
Trauma/Critical Care Follow Up Note  Subjective:    Overnight Issues:   Objective:  Vital signs for last 24 hours: Temp:  [98.2 F (36.8 C)-99.1 F (37.3 C)] 98.5 F (36.9 C) (07/10 0400) Pulse Rate:  [77-117] 85 (07/10 0700) Resp:  [24-45] 32 (07/10 0700) BP: (99-200)/(60-121) 144/70 (07/10 0700) SpO2:  [93 %-100 %] 99 % (07/10 0700)  Hemodynamic parameters for last 24 hours:    Intake/Output from previous day: 07/09 0701 - 07/10 0700 In: 3251.5 [P.O.:32; I.V.:2008.2; IV Piggyback:1211.4] Out: 4565 [Urine:3650; Drains:255; Stool:660]  Intake/Output this shift: No intake/output data recorded.  Vent settings for last 24 hours:    Physical Exam:  Gen: comfortable, no distress Neuro: non-focal exam HEENT: PERRL Neck: supple CV: RRR Pulm: unlabored breathing Abd: soft, midline vac b/l drains all purulent GU: clear yellow urine Extr: wwp, no edema   Results for orders placed or performed during the hospital encounter of 11/25/20 (from the past 24 hour(s))  Glucose, capillary     Status: Abnormal   Collection Time: 12/10/20  7:53 AM  Result Value Ref Range   Glucose-Capillary 138 (H) 70 - 99 mg/dL  CBC     Status: Abnormal   Collection Time: 12/10/20  9:19 AM  Result Value Ref Range   WBC 16.0 (H) 4.0 - 10.5 K/uL   RBC 3.04 (L) 4.22 - 5.81 MIL/uL   Hemoglobin 8.6 (L) 13.0 - 17.0 g/dL   HCT 31.5 (L) 40.0 - 86.7 %   MCV 88.5 80.0 - 100.0 fL   MCH 28.3 26.0 - 34.0 pg   MCHC 32.0 30.0 - 36.0 g/dL   RDW 61.9 (H) 50.9 - 32.6 %   Platelets 673 (H) 150 - 400 K/uL   nRBC 0.1 0.0 - 0.2 %  Glucose, capillary     Status: Abnormal   Collection Time: 12/10/20 12:34 PM  Result Value Ref Range   Glucose-Capillary 121 (H) 70 - 99 mg/dL  Glucose, capillary     Status: Abnormal   Collection Time: 12/10/20  3:41 PM  Result Value Ref Range   Glucose-Capillary 135 (H) 70 - 99 mg/dL  Glucose, capillary     Status: Abnormal   Collection Time: 12/10/20  7:23 PM  Result  Value Ref Range   Glucose-Capillary 129 (H) 70 - 99 mg/dL  Glucose, capillary     Status: Abnormal   Collection Time: 12/11/20 12:01 AM  Result Value Ref Range   Glucose-Capillary 125 (H) 70 - 99 mg/dL  CBC     Status: Abnormal   Collection Time: 12/11/20  5:00 AM  Result Value Ref Range   WBC 16.4 (H) 4.0 - 10.5 K/uL   RBC 3.06 (L) 4.22 - 5.81 MIL/uL   Hemoglobin 8.5 (L) 13.0 - 17.0 g/dL   HCT 71.2 (L) 45.8 - 09.9 %   MCV 90.8 80.0 - 100.0 fL   MCH 27.8 26.0 - 34.0 pg   MCHC 30.6 30.0 - 36.0 g/dL   RDW 83.3 (H) 82.5 - 05.3 %   Platelets 718 (H) 150 - 400 K/uL   nRBC 0.0 0.0 - 0.2 %    Assessment & Plan: The plan of care was discussed with the bedside nurse for the day, Dahlia Client, who is in agreement with this plan and no additional concerns were raised.   Present on Admission:  History of colostomy reversal    LOS: 16 days   Additional comments:I reviewed the patient's new clinical lab test results.   and I  reviewed the patients new imaging test results.    Remote h/o polytrauma with colostomy and parastomal hernia  - s/p exlap, colostomy reversal, and parastomal hernia repair. 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 Abdominal closure with incisional vac changes MWF-175cc purulent o/p. Having ostomy o/p. Abdominal binder. Drains with purulent o/p-80cc VDRF - extubated 7/4. Reintubated 7/5. Extubated 7/7 Sepsis - intra-abdominal source, s/p 9d zosyn, zyvoxx stopped 7/7, restarted 7/8 due to pt with subjective fevers and WBC up. Bacteroides on cx. Repeat CT 7/5 with no residual fluid collection. Bcx sent 7/5, NGTD and again 7/8. Eraxis started 7/8. De-escalated eraxis to fluconazole 7/9 and plan for 5d course of  zosyn/zyvoxx/flucon starting 7/8, end 7/12 unless clinically worsens. HTN - restart home meds Foley - removed, spont voiding FEN - PICC/TPN, adv to FLD, d/c TPN when it runs out today, 40 of lasix  again today DVT - SCDs, LMWH, PPI Dispo - 4NP  Diamantina Monks, MD Trauma & General Surgery Please use [email or web address] to contact on call provider  12/11/2020  *Care during the described time interval was provided by me. I have reviewed this patient's available data, including medical history, events of note, physical examination and test results as part of my evaluation.

## 2020-12-12 LAB — CBC
HCT: 25.6 % — ABNORMAL LOW (ref 39.0–52.0)
Hemoglobin: 8 g/dL — ABNORMAL LOW (ref 13.0–17.0)
MCH: 28.1 pg (ref 26.0–34.0)
MCHC: 31.3 g/dL (ref 30.0–36.0)
MCV: 89.8 fL (ref 80.0–100.0)
Platelets: 677 10*3/uL — ABNORMAL HIGH (ref 150–400)
RBC: 2.85 MIL/uL — ABNORMAL LOW (ref 4.22–5.81)
RDW: 17.1 % — ABNORMAL HIGH (ref 11.5–15.5)
WBC: 13.5 10*3/uL — ABNORMAL HIGH (ref 4.0–10.5)
nRBC: 0 % (ref 0.0–0.2)

## 2020-12-12 LAB — BASIC METABOLIC PANEL
Anion gap: 16 — ABNORMAL HIGH (ref 5–15)
BUN: 17 mg/dL (ref 6–20)
CO2: 27 mmol/L (ref 22–32)
Calcium: 7.9 mg/dL — ABNORMAL LOW (ref 8.9–10.3)
Chloride: 96 mmol/L — ABNORMAL LOW (ref 98–111)
Creatinine, Ser: 1.17 mg/dL (ref 0.61–1.24)
GFR, Estimated: >60 mL/min (ref >60)
Glucose, Bld: 128 mg/dL — ABNORMAL HIGH (ref 70–99)
Potassium: 3.8 mmol/L (ref 3.5–5.1)
Sodium: 139 mmol/L (ref 135–145)

## 2020-12-12 LAB — GLUCOSE, CAPILLARY
Glucose-Capillary: 103 mg/dL — ABNORMAL HIGH (ref 70–99)
Glucose-Capillary: 109 mg/dL — ABNORMAL HIGH (ref 70–99)
Glucose-Capillary: 117 mg/dL — ABNORMAL HIGH (ref 70–99)
Glucose-Capillary: 117 mg/dL — ABNORMAL HIGH (ref 70–99)
Glucose-Capillary: 119 mg/dL — ABNORMAL HIGH (ref 70–99)
Glucose-Capillary: 95 mg/dL (ref 70–99)
Glucose-Capillary: 97 mg/dL (ref 70–99)

## 2020-12-12 MED ORDER — AMLODIPINE BESYLATE 10 MG PO TABS
10.0000 mg | ORAL_TABLET | Freq: Every day | ORAL | Status: DC
  Administered 2020-12-12 – 2020-12-15 (×4): 10 mg via ORAL
  Filled 2020-12-12 (×4): qty 1

## 2020-12-12 MED ORDER — ENSURE ENLIVE PO LIQD
237.0000 mL | Freq: Three times a day (TID) | ORAL | Status: DC
  Administered 2020-12-12 – 2020-12-14 (×3): 237 mL via ORAL
  Filled 2020-12-12: qty 237

## 2020-12-12 NOTE — Progress Notes (Signed)
   12/11/20 2352  Assess: MEWS Score  Temp 99.2 F (37.3 C)  BP (!) 170/100  Pulse Rate (!) 110  ECG Heart Rate (!) 110  Resp (!) 32  Level of Consciousness Alert  SpO2 98 %  O2 Device Room Air  Assess: MEWS Score  MEWS Temp 0  MEWS Systolic 0  MEWS Pulse 1  MEWS RR 2  MEWS LOC 0  MEWS Score 3  MEWS Score Color Yellow  Assess: if the MEWS score is Yellow or Red  Were vital signs taken at a resting state? Yes  Focused Assessment No change from prior assessment  Early Detection of Sepsis Score *See Row Information* Low  MEWS guidelines implemented *See Row Information* Yes  Take Vital Signs  Increase Vital Sign Frequency  Yellow: Q 2hr X 2 then Q 4hr X 2, if remains yellow, continue Q 4hrs  Escalate  MEWS: Escalate Yellow: discuss with charge nurse/RN and consider discussing with provider and RRT  Notify: Charge Nurse/RN  Name of Charge Nurse/RN Notified Gladys, RN  Date Charge Nurse/RN Notified 12/12/20  Time Charge Nurse/RN Notified 0023  Document  Patient Outcome Stabilized after interventions  Progress note created (see row info) Yes

## 2020-12-12 NOTE — Progress Notes (Signed)
Inpatient Rehab Admissions Coordinator:   I do not yet have insurance auth or a bed for this Pt. On CIR. I continue to await decision from this insurance and will follow for potential admit pending bed availability and insurance auth.   Megan Salon, MS, CCC-SLP Rehab Admissions Coordinator  [phone] ([place]) [phone] (office)

## 2020-12-12 NOTE — Progress Notes (Signed)
Occupational Therapy Treatment Patient Details Name: Jesse Waters MRN: [id number] DOB: 18-Jul-1995 Today's Date: 12/12/2020    History of present illness 25 y.o. 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 Abdominal closure with incisional vac changed 7/3 ostomy with Abdominal binder sepsis extubated 7/4. Re-intubated 7/5. PMH includes:  HTN, PNA   OT comments  Pt limited by dyspnea with O2 sats >92% on RA, decreased activity tolerance and decreased ability to care for self. Pt improving with OOB ADL performing sitting/ standing tasks at sink with set-upA. Pt apprehensive about movement, but after encouragement, pt able to increase ability to care for self.  Pt set-upA to totalA for ADL and minA +2 for transfers and mobility with chair follow. Pt modA for bed mobility. Pt appears to be very motivated and wants to increase to PLOF. Pt would benefit from continued OT skilled services. OT following acutely. RR 22-25; O2 >93% on RA;  <120 BPM.  Pt would benefit from continued OT skilled services. OT following acutely.   Follow Up Recommendations  CIR    Equipment Recommendations  Other (comment)    Recommendations for Other Services Rehab consult    Precautions / Restrictions Precautions Precautions: Fall Precaution Comments: jp drain L side, wound vac to abdomen, gravity dependent drain R side Restrictions Weight Bearing Restrictions: No       Mobility Bed Mobility Overal bed mobility: Needs Assistance Bed Mobility: Rolling;Sidelying to Sit Rolling: Min assist Sidelying to sit: Mod assist;+2 for physical assistance;+2 for safety/equipment;HOB elevated       General bed mobility comments: ModA +2 for sidelying to sit as pt apprehensive about abodomen pain and decreased ability to go from sidelying to sitting EOB.    Transfers Overall transfer level: Needs  assistance Equipment used: Rolling walker (2 wheeled) Transfers: Sit to/from [employer] Sit to Stand: Min assist;+2 physical assistance;+2 safety/equipment Stand pivot transfers: Min assist;+2 physical assistance;+2 safety/equipment;From elevated surface       General transfer comment: Pt apprehensive about movement, but after encouragement, pt able to increase ability to care for self.    Balance Overall balance assessment: Needs assistance Sitting-balance support: Bilateral upper extremity supported;Feet supported Sitting balance-Leahy Scale: Fair     Standing balance support: Bilateral upper extremity supported;Single extremity supported;During functional activity Standing balance-Leahy Scale: Poor Standing balance comment: Reliant on BUEs for mobility; reliant on single UE when standing at sink.                           ADL either performed or assessed with clinical judgement   ADL Overall ADL's : Needs assistance/impaired     Grooming: Wash/dry hands;Wash/dry face;Oral care;Min guard;Standing;Set up;Sitting Grooming Details (indicate cue type and reason): standing x3 mins; 1 min of sitting                 Toilet Transfer: Minimal assistance;+2 for physical assistance;+2 for safety/equipment;BSC;RW Toilet Transfer Details (indicate cue type and reason): walking into bathroom to sit on BSC         Functional mobility during ADLs: Minimal assistance;+2 for physical assistance;+2 for safety/equipment General ADL Comments: Pt limited by dyspnea with O2 sats >92% on RA, decreased activity tolerance and decreased ability to care for self. Pt improving with OOB ADL performing sitting/ standing tasks at sink with set-upA.     Vision   Vision Assessment?: No apparent visual  deficits   Perception     Praxis      Cognition Arousal/Alertness: Awake/alert Behavior During Therapy: Flat affect Overall Cognitive Status: Within Functional  Limits for tasks assessed                                 General Comments: Flat affect, conversant, but dyspnea noted with minimal exertion; delayed response.        Exercises     Shoulder Instructions       General Comments RR 22-25; O2 >93% on RA;  <120 BPM.    Pertinent Vitals/ Pain       Pain Assessment: Faces Faces Pain Scale: Hurts little more Pain Location: abdomen Pain Descriptors / Indicators: Discomfort;Guarding Pain Intervention(s): Monitored during session;Repositioned  Home Living                                          Prior Functioning/Environment              Frequency  Min 2X/week        Progress Toward Goals  OT Goals(current goals can now be found in the care plan section)  Progress towards OT goals: Progressing toward goals  Acute Rehab OT Goals Patient Stated Goal: to walk OT Goal Formulation: With patient Time For Goal Achievement: 12/19/20 Potential to Achieve Goals: Good ADL Goals Pt Will Perform Grooming: with min guard assist;sitting Pt Will Perform Upper Body Bathing: with min guard assist;sitting Pt Will Perform Lower Body Bathing: with mod assist;sit to/from stand;with adaptive equipment Pt Will Perform Upper Body Dressing: with min guard assist;sitting Pt Will Perform Lower Body Dressing: sit to/from stand;with mod assist;with adaptive equipment Pt Will Transfer to Toilet: with +2 assist;with mod assist;bedside commode;stand pivot transfer Pt Will Perform Toileting - Clothing Manipulation and hygiene: with min guard assist;sit to/from stand Pt Will Perform Tub/Shower Transfer: Shower transfer;ambulating;shower seat;3 in 1;rolling walker  Plan Discharge plan remains appropriate    Co-evaluation    PT/OT/SLP Co-Evaluation/Treatment: Yes Reason for Co-Treatment: For patient/therapist safety   OT goals addressed during session: ADL's and self-care      AM-PAC OT "6 Clicks" Daily Activity      Outcome Measure   Help from another person eating meals?: None Help from another person taking care of personal grooming?: A Little Help from another person toileting, which includes using toliet, bedpan, or urinal?: A Lot Help from another person bathing (including washing, rinsing, drying)?: A Lot Help from another person to put on and taking off regular upper body clothing?: A Little Help from another person to put on and taking off regular lower body clothing?: Total 6 Click Score: 15    End of Session Equipment Utilized During Treatment: Oxygen  OT Visit Diagnosis: Unsteadiness on feet (R26.81);Muscle weakness (generalized) (M62.81);Pain Pain - part of body:  (abdomen)   Activity Tolerance Patient tolerated treatment well   Patient Left in chair;with call bell/phone within reach;with family/visitor present   Nurse Communication Mobility status;Precautions        Time: 6045-4098 OT Time Calculation (min): 40 min  Charges: OT General Charges $OT Visit: 1 Visit OT Treatments $Self Care/Home Management : 8-22 mins $Therapeutic Activity: 8-22 mins  Flora Lipps, OTR/L Acute Rehabilitation Services Pager: [phone] Office: [phone]     Tyrease Vandeberg C 12/12/2020, 12:50 PM
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
## 2020-12-12 NOTE — Consult Note (Signed)
WOC Nurse ostomy follow up Patient receiving care in [facility] 4N08 Stoma type/location:  LLQ colostomy Stomal assessment/size: 1 3/4" budded moist and pink with minor mucocutaneous separation at nine o'clock.  Peristomal assessment: has psoriasis on surrounding skin Treatment options for stomal/peristomal skin:  13M skin barrier wipes used [employer] [place] # [phone]) Output; Mushy green/brown Ostomy pouching: 2pc. 2 1/4 used for smaller skin barrier next to the wound. Education provided: None Enrolled patient in [employer] Discharge program: No  Patient is independent with ostomy care, he states that it is just in a different location than it was before.   Renaldo Reel Katrinka Blazing, MSN, RN, CMSRN, Angus Seller, [facility] Wound Treatment Associate Pager [phone]

## 2020-12-12 NOTE — Progress Notes (Signed)
Physical Therapy Treatment Patient Details Name: Jesse Waters MRN: [id number] DOB: 12/21/1995 Today's Date: 12/12/2020    History of Present Illness 25 y.o. 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 Abdominal closure with incisional vac changed 7/3 ostomy with Abdominal binder sepsis extubated 7/4. Re-intubated 7/5. PMH includes:  HTN, PNA    PT Comments    The pt was able to make great progress with mobility and activity tolerance this day. The pt was eager to mobilize despite endorsing some anxieties regarding potential for pain with movement. The pt was able to complete short bouts of ambulation in the room, maintain static standing for self-care for 1-2 minutes with UE support, and then progress to hallway ambulation with minA of 2 for safety and line management. The pt will continue to benefit from max mobility from PT and staff to further improve functional endurance and strength as well as skilled PT to progress dynamic stability and strength. Continue to recommend CIR.    Follow Up Recommendations  CIR     Equipment Recommendations  3in1 (PT)    Recommendations for Other Services       Precautions / Restrictions Precautions Precautions: Fall Precaution Comments: jp drain L side, wound vac to abdomen, gravity dependent drain R side Restrictions Weight Bearing Restrictions: No    Mobility  Bed Mobility Overal bed mobility: Needs Assistance Bed Mobility: Rolling;Sidelying to Sit Rolling: Min assist Sidelying to sit: Mod assist;+2 for physical assistance;+2 for safety/equipment;HOB elevated       General bed mobility comments: ModA +2 for sidelying to sit as pt apprehensive about abodomen pain and decreased ability to go from sidelying to sitting EOB.    Transfers Overall transfer level: Needs assistance Equipment used: Rolling walker (2  wheeled) Transfers: Sit to/from [employer] Sit to Stand: Min assist;+2 physical assistance;+2 safety/equipment Stand pivot transfers: Min assist;+2 physical assistance;+2 safety/equipment;From elevated surface       General transfer comment: Pt apprehensive about movement, but after encouragement, pt able to increase ability to care for self.  Ambulation/Gait Ambulation/Gait assistance: Min assist;+2 safety/equipment Gait Distance (Feet): 10 Feet (+ 35 ft) Assistive device: Rolling walker (2 wheeled) Gait Pattern/deviations: Step-through pattern;Decreased stride length;Shuffle Gait velocity: reduced Gait velocity interpretation: <1.31 ft/sec, indicative of household ambulator General Gait Details: pt with short, shuffling steps with minimal clearance. Intermittently with posterior lean needing minA to minG to steady and manage RW      Balance Overall balance assessment: Needs assistance Sitting-balance support: Bilateral upper extremity supported;Feet supported Sitting balance-Leahy Scale: Fair Sitting balance - Comments: pt able to sustain with BLE supported, no UE support   Standing balance support: Bilateral upper extremity supported;Single extremity supported;During functional activity Standing balance-Leahy Scale: Poor Standing balance comment: Reliant on BUEs for mobility; reliant on single UE when standing at sink.                            Cognition Arousal/Alertness: Awake/alert Behavior During Therapy: Flat affect Overall Cognitive Status: Within Functional Limits for tasks assessed                                 General Comments: Flat affect, conversant, but dyspnea noted with minimal exertion; delayed response.      Exercises General Exercises - Lower Extremity Long Arc Quad:  AROM;Both;5 reps;Seated Toe Raises: AROM;Both;10 reps;Seated Heel Raises: AROM;Both;10 reps;Seated    General Comments General comments (skin  integrity, edema, etc.): RR 22-25; O2 >93% on RA;  <120 BPM      Pertinent Vitals/Pain Pain Assessment: Faces Faces Pain Scale: Hurts little more Pain Location: abdomen Pain Descriptors / Indicators: Discomfort;Guarding Pain Intervention(s): Monitored during session     PT Goals (current goals can now be found in the care plan section) Acute Rehab PT Goals Patient Stated Goal: to walk PT Goal Formulation: With patient Time For Goal Achievement: 12/19/20 Potential to Achieve Goals: Good Progress towards PT goals: Progressing toward goals    Frequency    Min 3X/week      PT Plan Current plan remains appropriate    Co-evaluation PT/OT/SLP Co-Evaluation/Treatment: Yes Reason for Co-Treatment: To address functional/ADL transfers;For patient/therapist safety PT goals addressed during session: Mobility/safety with mobility;Balance;Proper use of DME OT goals addressed during session: ADL's and self-care      AM-PAC PT "6 Clicks" Mobility   Outcome Measure  Help needed turning from your back to your side while in a flat bed without using bedrails?: A Lot Help needed moving from lying on your back to sitting on the side of a flat bed without using bedrails?: A Lot Help needed moving to and from a bed to a chair (including a wheelchair)?: A Little Help needed standing up from a chair using your arms (e.g., wheelchair or bedside chair)?: A Lot Help needed to walk in hospital room?: A Little Help needed climbing 3-5 steps with a railing? : Total 6 Click Score: 13    End of Session Equipment Utilized During Treatment: Gait belt Activity Tolerance: Patient limited by fatigue;Patient limited by pain Patient left: with call bell/phone within reach;with family/visitor present;in chair Nurse Communication: Mobility status PT Visit Diagnosis: Muscle weakness (generalized) (M62.81);Difficulty in walking, not elsewhere classified (R26.2);Pain;Unsteadiness on feet (R26.81);Other  abnormalities of gait and mobility (R26.89)     Time: 9604-5409 PT Time Calculation (min) (ACUTE ONLY): 37 min  Charges:  $Gait Training: 8-22 mins                     Rolm Baptise, PT, DPT   Acute Rehabilitation Department Pager #: [phone]   Gaetana Michaelis 12/12/2020, 1:08 PM
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
## 2020-12-12 NOTE — Consult Note (Signed)
WOC Nurse wound follow up Patient receiving care in [facility] 4N08. Assisted with NPWT dressing change by Ivonne Andrew, RN. Wound type: abdominal surgical Measurement: upper wound measures 14.2 cm x 3.8 cm x 2.1 cm it is 100% pink granulation tissue. The inferior wound measures 4.8 cm x 2.8 cm x 4.5 cm  and has undermining at 12 o'clock of 3 cm.  It is 100% red granulation tissue Wound bed:Marland Kitchen As above Drainage (amount, consistency, odor) pink/tan in cannister Periwound: protected by multiple skin barrier films and narrow silicone dressings over areas under drape impacted by psoriasis. Dressing procedure/placement/frequency: one piece of black foam removed from each wound. One piece of black foam placed in each wound, bridged together and drape applied. Immediate seal obtained.  Patient had received oral premedication for pain. At the patient's request I asked his nurse Domenic Schwab to provide him with something IV. Supplies for the week requested. Helmut Muster, RN, MSN, CWOCN, CNS-BC, pager [phone]

## 2020-12-13 ENCOUNTER — Inpatient Hospital Stay (HOSPITAL_COMMUNITY): Payer: No Typology Code available for payment source

## 2020-12-13 LAB — BASIC METABOLIC PANEL
Anion gap: 6 (ref 5–15)
BUN: 12 mg/dL (ref 6–20)
CO2: 26 mmol/L (ref 22–32)
Calcium: 8.2 mg/dL — ABNORMAL LOW (ref 8.9–10.3)
Chloride: 105 mmol/L (ref 98–111)
Creatinine, Ser: 0.97 mg/dL (ref 0.61–1.24)
GFR, Estimated: >60 mL/min (ref >60)
Glucose, Bld: 100 mg/dL — ABNORMAL HIGH (ref 70–99)
Potassium: 4 mmol/L (ref 3.5–5.1)
Sodium: 137 mmol/L (ref 135–145)

## 2020-12-13 LAB — CBC
HCT: 25.8 % — ABNORMAL LOW (ref 39.0–52.0)
Hemoglobin: 8.1 g/dL — ABNORMAL LOW (ref 13.0–17.0)
MCH: 27.9 pg (ref 26.0–34.0)
MCHC: 31.4 g/dL (ref 30.0–36.0)
MCV: 89 fL (ref 80.0–100.0)
Platelets: 734 10*3/uL — ABNORMAL HIGH (ref 150–400)
RBC: 2.9 MIL/uL — ABNORMAL LOW (ref 4.22–5.81)
RDW: 16.6 % — ABNORMAL HIGH (ref 11.5–15.5)
WBC: 13.4 10*3/uL — ABNORMAL HIGH (ref 4.0–10.5)
nRBC: 0 % (ref 0.0–0.2)

## 2020-12-13 LAB — GLUCOSE, CAPILLARY
Glucose-Capillary: 119 mg/dL — ABNORMAL HIGH (ref 70–99)
Glucose-Capillary: 103 mg/dL — ABNORMAL HIGH (ref 70–99)
Glucose-Capillary: 126 mg/dL — ABNORMAL HIGH (ref 70–99)

## 2020-12-13 MED ORDER — IOHEXOL 9 MG/ML PO SOLN
ORAL | Status: AC
  Administered 2020-12-13: 500 mL
  Filled 2020-12-13: qty 1000

## 2020-12-13 MED ORDER — IOHEXOL 300 MG/ML  SOLN
100.0000 mL | Freq: Once | INTRAMUSCULAR | Status: AC | PRN
  Administered 2020-12-13: 100 mL via INTRAVENOUS
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
## 2020-12-13 NOTE — Progress Notes (Signed)
Trauma/Critical Care Follow Up Note  Subjective:    Overnight Issues:   Objective:  Vital signs for last 24 hours: Temp:  [97.6 F (36.4 C)-99.4 F (37.4 C)] 98.2 F (36.8 C) (07/12 0714) Pulse Rate:  [87-113] 92 (07/12 0714) Resp:  [20-41] 20 (07/12 0714) BP: (129-202)/(61-105) 158/70 (07/12 0714) SpO2:  [92 %-97 %] 96 % (07/12 0714)  Hemodynamic parameters for last 24 hours:    Intake/Output from previous day: 07/11 0701 - 07/12 0700 In: 2325.8 [P.O.:1460; I.V.:120; IV Piggyback:745.8] Out: 5935 [Urine:4275; Drains:160; Stool:1500]  Intake/Output this shift: No intake/output data recorded.  Vent settings for last 24 hours:    Physical Exam:  Gen: comfortable, no distress Neuro: non-focal exam HEENT: PERRL Neck: supple CV: RRR Pulm: unlabored breathing Abd: soft, midline vac, drains x2 GU: clear yellow urine Extr: wwp, no edema   Results for orders placed or performed during the hospital encounter of 11/25/20 (from the past 24 hour(s))  Glucose, capillary     Status: Abnormal   Collection Time: 12/12/20 11:24 AM  Result Value Ref Range   Glucose-Capillary 109 (H) 70 - 99 mg/dL  Glucose, capillary     Status: None   Collection Time: 12/12/20  5:32 PM  Result Value Ref Range   Glucose-Capillary 95 70 - 99 mg/dL  Glucose, capillary     Status: Abnormal   Collection Time: 12/12/20  7:39 PM  Result Value Ref Range   Glucose-Capillary 117 (H) 70 - 99 mg/dL  Glucose, capillary     Status: Abnormal   Collection Time: 12/12/20 11:17 PM  Result Value Ref Range   Glucose-Capillary 119 (H) 70 - 99 mg/dL  Glucose, capillary     Status: Abnormal   Collection Time: 12/13/20  3:40 AM  Result Value Ref Range   Glucose-Capillary 119 (H) 70 - 99 mg/dL  CBC     Status: Abnormal   Collection Time: 12/13/20  5:59 AM  Result Value Ref Range   WBC 13.4 (H) 4.0 - 10.5 K/uL   RBC 2.90 (L) 4.22 - 5.81 MIL/uL   Hemoglobin 8.1 (L) 13.0 - 17.0 g/dL   HCT 51.7 (L) 00.1 -  52.0 %   MCV 89.0 80.0 - 100.0 fL   MCH 27.9 26.0 - 34.0 pg   MCHC 31.4 30.0 - 36.0 g/dL   RDW 74.9 (H) 44.9 - 67.5 %   Platelets 734 (H) 150 - 400 K/uL   nRBC 0.0 0.0 - 0.2 %  Basic metabolic panel     Status: Abnormal   Collection Time: 12/13/20  5:59 AM  Result Value Ref Range   Sodium 137 135 - 145 mmol/L   Potassium 4.0 3.5 - 5.1 mmol/L   Chloride 105 98 - 111 mmol/L   CO2 26 22 - 32 mmol/L   Glucose, Bld 100 (H) 70 - 99 mg/dL   BUN 12 6 - 20 mg/dL   Creatinine, Ser 9.16 0.61 - 1.24 mg/dL   Calcium 8.2 (L) 8.9 - 10.3 mg/dL   GFR, Estimated >38 >46 mL/min   Anion gap 6 5 - 15  Glucose, capillary     Status: Abnormal   Collection Time: 12/13/20  7:41 AM  Result Value Ref Range   Glucose-Capillary 103 (H) 70 - 99 mg/dL    Assessment & Plan: The plan of care was discussed with the bedside nurse for the day, Domenic Schwab, who is in agreement with this plan and no additional concerns were raised.   Present on Admission:  History of colostomy reversal    LOS: 18 days   Additional comments:I reviewed the patient's new clinical lab test results.   and I reviewed the patients new imaging test results.    Remote h/o polytrauma with colostomy and parastomal hernia  - s/p exlap, colostomy reversal, and parastomal hernia repair. 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 Abdominal closure with incisional vac changes MWF-50cc purulent o/p. Having ostomy o/p. Abdominal binder. Drains with purulent o/p-110cc VDRF - extubated 7/4. Reintubated 7/5. Extubated 7/7 Sepsis - intra-abdominal source, s/p 9d zosyn, zyvoxx stopped 7/7, restarted 7/8 due to pt with subjective fevers and WBC up. Bacteroides on cx. Repeat CT 7/5 with no residual fluid collection. Bcx sent 7/5, NGTD and again 7/8. Eraxis started 7/8. De-escalated eraxis to fluconazole 7/9 and plan for 5d course of zosyn/zyvoxx/flucon starting 7/8, end 7/12 unless  clinically worsens, followed by transition to augmentin x14d total abx from same start date. CT A/P with IV/PO contrast today looks okay, pending final read HTN - restarted home meds, added scheduled norvasc 7/11 Foley - removed, spont voiding FEN - PICC/TPN, soft diet, TPN off DVT - SCDs, LMWH, PPI Dispo - 4NP, CIR c/s, stable for discharge 7/12 after completion of IV abx course. Therapy to see, progressing well.  Diamantina Monks, MD Trauma & General Surgery Please use [email or web address] to contact on call provider  12/13/2020  *Care during the described time interval was provided by me. I have reviewed this patient's available data, including medical history, events of note, physical examination and test results as part of my evaluation.

## 2020-12-13 NOTE — Progress Notes (Signed)
Inpatient Rehab Admissions Coordinator:   I opened a case with Pt.'s insurer for CIR yesterday and await a response. Pt. Updated. I will pursue admit pending insurance auth or bed availability.  Megan Salon, MS, CCC-SLP Rehab Admissions Coordinator  [phone] (celll) [phone] (office)

## 2020-12-14 LAB — CBC
HCT: 28.1 % — ABNORMAL LOW (ref 39.0–52.0)
Hemoglobin: 8.7 g/dL — ABNORMAL LOW (ref 13.0–17.0)
MCH: 28 pg (ref 26.0–34.0)
MCHC: 31 g/dL (ref 30.0–36.0)
MCV: 90.4 fL (ref 80.0–100.0)
Platelets: 774 10*3/uL — ABNORMAL HIGH (ref 150–400)
RBC: 3.11 MIL/uL — ABNORMAL LOW (ref 4.22–5.81)
RDW: 16.8 % — ABNORMAL HIGH (ref 11.5–15.5)
WBC: 13.4 10*3/uL — ABNORMAL HIGH (ref 4.0–10.5)
nRBC: 0 % (ref 0.0–0.2)

## 2020-12-14 LAB — CULTURE, BLOOD (ROUTINE X 2)
Culture: NO GROWTH
Culture: NO GROWTH
Special Requests: ADEQUATE
Special Requests: ADEQUATE

## 2020-12-14 LAB — BASIC METABOLIC PANEL
Anion gap: 7 (ref 5–15)
BUN: 12 mg/dL (ref 6–20)
CO2: 26 mmol/L (ref 22–32)
Calcium: 8.5 mg/dL — ABNORMAL LOW (ref 8.9–10.3)
Chloride: 104 mmol/L (ref 98–111)
Creatinine, Ser: 1.04 mg/dL (ref 0.61–1.24)
GFR, Estimated: >60 mL/min (ref >60)
Glucose, Bld: 116 mg/dL — ABNORMAL HIGH (ref 70–99)
Potassium: 3.9 mmol/L (ref 3.5–5.1)
Sodium: 137 mmol/L (ref 135–145)

## 2020-12-14 LAB — GLUCOSE, CAPILLARY: Glucose-Capillary: 117 mg/dL — ABNORMAL HIGH (ref 70–99)

## 2020-12-14 MED ORDER — AMOXICILLIN-POT CLAVULANATE 875-125 MG PO TABS
1.0000 | ORAL_TABLET | Freq: Two times a day (BID) | ORAL | Status: DC
  Administered 2020-12-14 – 2020-12-15 (×2): 1 via ORAL
  Filled 2020-12-14 (×3): qty 1

## 2020-12-14 NOTE — Progress Notes (Signed)
Physical Therapy Treatment Patient Details Name: Jesse Waters MRN: [id number] DOB: [birth year] Today's Date: 12/14/2020    History of Present Illness 25 y.o. 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 Abdominal closure with incisional vac changed 7/3 ostomy with Abdominal binder sepsis extubated 7/4. Re-intubated 7/5. PMH includes:  HTN, PNA    PT Comments    Pt making excellent progress towards physical therapy goals; very motivated to participate with goal to discharge home. Pt ambulating x 250 feet with a walker at a supervision-min guard assist level. Able to negotiate 3 steps with bilateral railings. HR peak 128 bpm, SpO2 96% on RA. Pt denies dyspnea on exertion. Overall demonstrates much improved pain control, activity tolerance, ambulation distance. Updated d/c plan in light of this.     Follow Up Recommendations  Supervision for mobility/OOB;Home health PT     Equipment Recommendations  3in1 (PT) (bariatric)   Recommendations for Other Services       Precautions / Restrictions Precautions Precautions: Fall Precaution Comments: jp drain L side, wound vac to abdomen, gravity dependent drain R side Restrictions Weight Bearing Restrictions: No    Mobility  Bed Mobility Overal bed mobility: Needs Assistance Bed Mobility: Rolling;Sidelying to Sit Rolling: Supervision Sidelying to sit: Min assist       General bed mobility comments: Rolling to left side, minA for trunk assist to upright    Transfers Overall transfer level: Needs assistance Equipment used: Rolling walker (2 wheeled) Transfers: Sit to/from Stand Sit to Stand: Min guard         General transfer comment: Min guard to rise from edge of bed and recliner  Ambulation/Gait Ambulation/Gait assistance: Supervision;Min guard Gait Distance (Feet): 250 Feet Assistive device: Rolling  walker (2 wheeled) Gait Pattern/deviations: Step-through pattern;Decreased stride length;Wide base of support Gait velocity: reduced   General Gait Details: Slow and steady pace, initially min guard but progressing to supervision level. cues for upright posture   Stairs Stairs: Yes Stairs assistance: Supervision Stair Management: Two rails Number of Stairs: 3 General stair comments: step by step pattern   Wheelchair Mobility    Modified Rankin (Stroke Patients Only)       Balance Overall balance assessment: Needs assistance Sitting-balance support: Feet supported Sitting balance-Leahy Scale: Good     Standing balance support: During functional activity;Single extremity supported Standing balance-Leahy Scale: Fair                              Cognition Arousal/Alertness: Awake/alert Behavior During Therapy: Flat affect Overall Cognitive Status: Within Functional Limits for tasks assessed                                 General Comments: Flat affect, conversant      Exercises      General Comments        Pertinent Vitals/Pain Pain Assessment: Faces Faces Pain Scale: Hurts little more Pain Location: abdomen Pain Descriptors / Indicators: Discomfort;Guarding Pain Intervention(s): Monitored during session    Home Living                      Prior Function            PT Goals (current goals can now be found in the care plan section) Acute Rehab PT Goals  Patient Stated Goal: go home PT Goal Formulation: With patient Time For Goal Achievement: 12/19/20 Potential to Achieve Goals: Good Progress towards PT goals: Progressing toward goals    Frequency    Min 3X/week      PT Plan Discharge plan needs to be updated    Co-evaluation              AM-PAC PT "6 Clicks" Mobility   Outcome Measure  Help needed turning from your back to your side while in a flat bed without using bedrails?: None Help needed  moving from lying on your back to sitting on the side of a flat bed without using bedrails?: A Little Help needed moving to and from a bed to a chair (including a wheelchair)?: A Little Help needed standing up from a chair using your arms (e.g., wheelchair or bedside chair)?: A Little Help needed to walk in hospital room?: A Little Help needed climbing 3-5 steps with a railing? : A Little 6 Click Score: 19    End of Session Equipment Utilized During Treatment: Gait belt Activity Tolerance: Patient tolerated treatment well Patient left: with call bell/phone within reach;with family/visitor present;in chair Nurse Communication: Mobility status PT Visit Diagnosis: Muscle weakness (generalized) (M62.81);Difficulty in walking, not elsewhere classified (R26.2);Pain;Unsteadiness on feet (R26.81);Other abnormalities of gait and mobility (R26.89)     Time: 2992-4268 PT Time Calculation (min) (ACUTE ONLY): 24 min  Charges:  $Gait Training: 8-22 mins $Therapeutic Activity: 8-22 mins                     Lillia Pauls, PT, DPT Acute Rehabilitation Services Pager [phone] Office [phone]    Norval Morton 12/14/2020, 3:13 PM

## 2020-12-14 NOTE — Consult Note (Addendum)
WOC Nurse wound follow up Patient receiving care in [facility] 4N08 Wound type: Surgical Wound bed: Pink/red with 90% healthy granulation tissue in the upper wound. Beefy red in the lower/deeper wound. Staples around the navel still intact with a small portion of the surrounding skin at the navel pulling apart and is draining yellow drainage.  Measurements: 13.3 x 3.3 x 2.2 upper wound 4.8 x 2.5 x 3.5 lower wound.  Drainage (amount, consistency, odor) Tan drainage in canister. Periwound: Scattered psoriasis throughout the abdomen.  Dressing procedure/placement/frequency: 3 pieces of black foam dressing removed slowly with NS to wet the foam. The surrounding skin was cleaned with 55M skin barrier wipes. The upper area of skin on the abdomen and sides next to the wound were draped with 5 Mepilex border foam dressings size 5 x 12.5 cm to help the drape to stay in place. 1 piece of black foam placed into the upper wound, 1 piece placed into the lower wound. A small piece of Aquacel Advantage was placed over the skin that connects the 2 wounds, secured with a small Mepilex foam dressing. 1 small piece of black foam to bridge the 2 wounds together. Drape applied, immediate suction obtained at 125 mm Hg. Recommend discontinuing the wound vac at discharge and changing to wet/dry dressing twice daily at home if [facility] is not able to be obtained.   WOC Nurse ostomy follow up Stoma type/location:  LLQ colostomy Stomal assessment/size: 1 3/4" budded moist and pink Peristomal assessment: intact Treatment options for stomal/peristomal skin:  55M skin barrier wipes used [facility] [place] # [phone]) Output; Mushy green/brown Ostomy pouching: 2pc. 2 1/4 used for smaller skin barrier next to the wound. Education provided: None Enrolled patient in [place] Secure Start Discharge program: No  Pouch not changed today but was emptied. Patient is independent with pouch changes however may need assistance due to the stoma being so close to  the wound.    WOC is available M-F 8:00-3:00. Please page if needed. WOC will FU on M W F.   Renaldo Reel Katrinka Blazing, MSN, RN, CMSRN, Angus Seller, [facility] Wound Treatment Associate Pager [phone]

## 2020-12-14 NOTE — Progress Notes (Signed)
Room air sat during sleep 80%, placed on 2L nasal cannula. Sats recover to 92% after waking up, and 97% with 2L oxygen.

## 2020-12-14 NOTE — Progress Notes (Signed)
Inpatient Rehab Admissions Coordinator:   I received insurance auth for CIR; however this pt. Worked with PT this AM and was at supervision level for ambulation and stairs. He does not appear to require intensity of CIR at this time. CIR to sign off.    Megan Salon, MS, CCC-SLP Rehab Admissions Coordinator  [phone] (celll) [phone] (office)

## 2020-12-14 NOTE — Progress Notes (Signed)
Patient refused antibiotics this morning because he said yesterday was the last day he was supposed to take it.

## 2020-12-14 NOTE — Progress Notes (Signed)
The doctor called into the room around 2 pm and said that he can be discharged tomorrow and the IV antibiotics can be stopped and he will be sent home with oral antibiotics.

## 2020-12-14 NOTE — TOC Progression Note (Addendum)
Transition of Care [facility]) - Progression Note    Patient Details  Name: Jesse Waters MRN: [id number] Date of Birth: [birth year]  Transition of Care [facility] CM/SW Contact  Jesse Mac, RN Phone Number: 12/14/2020, 5:06 PM  Clinical Narrative:   Patient has made excellent progress with physical therapy; notified that patient is planning for discharge tomorrow with home wound VAC.  Patient plans to discharge to his sister's home at [address] in [place], [place] [address].  Patient's sister is an Char[employer] fundraiser; his mother will also be able to assist with care.  Notified [employer] rep Blossom Hoops of need for home wound VAC on 12/15/2020; signed prescription for [facility] therapy sent to rep. Patient will need home health nurse for wound VAC dressing changes, physical and occupational therapy.  Referral to [facility] health for follow-up, as is contracted with patient's insurance. Per PT/OT patient needs rolling Waters and bariatric bedside commode.  Referral to [employer] for recommended DME, to be delivered to bedside prior to discharge. Reviewed all discharge arrangements with patient, mother, and sister.  They are all in agreement with plan.  Will follow with updates on timing of delivery of home wound VAC.  Expected Discharge Plan: Home w Home Health Services Barriers to Discharge: Continued Medical Work up  Expected Discharge Plan and Services Expected Discharge Plan: Home w Home Health Services   Discharge Planning Services: CM Consult   Living arrangements for the past 2 months: Single Family Home Expected Discharge Date: 12/15/20               DME Arranged: [facility] [place] DME Agency: [employer] Date DME Agency Contacted: 12/14/20 Time DME Agency Contacted: [phone] Representative spoke with at DME Agency: Velna Hatchet with [employer]; Blossom Hoops with [employer] HH Arranged: RN, PT, OT [facility] Agency: [facility] Date [facility] Agency Contacted: 12/14/20 Time HH Agency Contacted: [phone] Representative spoke with at [facility]  Agency: Lorenza Chick   Social Determinants of Health (SDOH) Interventions    Readmission Risk Interventions No flowsheet data found.  Quintella Baton, RN, BSN  Trauma/Neuro ICU Case Manager [phone]

## 2020-12-15 ENCOUNTER — Other Ambulatory Visit (HOSPITAL_COMMUNITY): Payer: Self-pay

## 2020-12-15 LAB — BASIC METABOLIC PANEL
Anion gap: 9 (ref 5–15)
BUN: 11 mg/dL (ref 6–20)
CO2: 24 mmol/L (ref 22–32)
Calcium: 8.8 mg/dL — ABNORMAL LOW (ref 8.9–10.3)
Chloride: 103 mmol/L (ref 98–111)
Creatinine, Ser: 0.98 mg/dL (ref 0.61–1.24)
GFR, Estimated: >60 mL/min (ref >60)
Glucose, Bld: 121 mg/dL — ABNORMAL HIGH (ref 70–99)
Potassium: 3.6 mmol/L (ref 3.5–5.1)
Sodium: 136 mmol/L (ref 135–145)

## 2020-12-15 LAB — CBC
HCT: 27.9 % — ABNORMAL LOW (ref 39.0–52.0)
Hemoglobin: 8.7 g/dL — ABNORMAL LOW (ref 13.0–17.0)
MCH: 27.8 pg (ref 26.0–34.0)
MCHC: 31.2 g/dL (ref 30.0–36.0)
MCV: 89.1 fL (ref 80.0–100.0)
Platelets: 758 10*3/uL — ABNORMAL HIGH (ref 150–400)
RBC: 3.13 MIL/uL — ABNORMAL LOW (ref 4.22–5.81)
RDW: 16.4 % — ABNORMAL HIGH (ref 11.5–15.5)
WBC: 13 10*3/uL — ABNORMAL HIGH (ref 4.0–10.5)
nRBC: 0 % (ref 0.0–0.2)

## 2020-12-15 MED ORDER — DOCUSATE SODIUM 100 MG PO CAPS: 100.0000 mg | ORAL_CAPSULE | Freq: Two times a day (BID) | ORAL | 1 refills | Status: DC

## 2020-12-15 MED ORDER — AMOXICILLIN-POT CLAVULANATE 875-125 MG PO TABS
1.0000 | ORAL_TABLET | Freq: Two times a day (BID) | ORAL | 0 refills | Status: AC
Start: 2020-12-15 — End: ?
  Filled 2020-12-15: qty 16, 8d supply, fill #0

## 2020-12-15 MED ORDER — IBUPROFEN 600 MG PO TABS: 600.0000 mg | ORAL_TABLET | Freq: Four times a day (QID) | ORAL | 1 refills | Status: DC

## 2020-12-15 MED ORDER — ACETAMINOPHEN 500 MG PO TABS: 1000.0000 mg | ORAL_TABLET | Freq: Four times a day (QID) | ORAL | 0 refills | Status: DC

## 2020-12-15 MED ORDER — METHOCARBAMOL 750 MG PO TABS: 750.0000 mg | ORAL_TABLET | Freq: Four times a day (QID) | ORAL | 1 refills | Status: DC

## 2020-12-15 MED ORDER — OXYCODONE HCL 5 MG PO TABS
5.0000 mg | ORAL_TABLET | ORAL | 0 refills | Status: AC | PRN
  Filled 2020-12-15: qty 30, 5d supply, fill #0

## 2020-12-15 MED ORDER — METHOCARBAMOL 750 MG PO TABS
750.0000 mg | ORAL_TABLET | Freq: Four times a day (QID) | ORAL | 1 refills | Status: AC
Start: 2020-12-15 — End: ?
  Filled 2020-12-15: qty 120, 30d supply, fill #0

## 2020-12-15 MED ORDER — OXYCODONE HCL 5 MG PO TABS: 5.0000 mg | ORAL_TABLET | ORAL | 0 refills | Status: DC | PRN

## 2020-12-15 MED ORDER — DOCUSATE SODIUM 100 MG PO CAPS
100.0000 mg | ORAL_CAPSULE | Freq: Two times a day (BID) | ORAL | 1 refills | Status: AC
  Filled 2020-12-15: qty 60, 30d supply, fill #0

## 2020-12-15 MED ORDER — AMLODIPINE BESYLATE 10 MG PO TABS: 10.0000 mg | ORAL_TABLET | Freq: Every day | ORAL | 0 refills | Status: DC

## 2020-12-15 MED ORDER — IBUPROFEN 600 MG PO TABS
600.0000 mg | ORAL_TABLET | Freq: Four times a day (QID) | ORAL | 1 refills | Status: AC
  Filled 2020-12-15: qty 120, 30d supply, fill #0

## 2020-12-15 MED ORDER — ACETAMINOPHEN 500 MG PO TABS: 1000.0000 mg | ORAL_TABLET | Freq: Four times a day (QID) | ORAL | 0 refills | Status: AC

## 2020-12-15 MED ORDER — AMLODIPINE BESYLATE 10 MG PO TABS
10.0000 mg | ORAL_TABLET | Freq: Every day | ORAL | 0 refills | Status: AC
  Filled 2020-12-15: qty 30, 30d supply, fill #0

## 2020-12-15 MED ORDER — AMOXICILLIN-POT CLAVULANATE 875-125 MG PO TABS: 1.0000 | ORAL_TABLET | Freq: Two times a day (BID) | ORAL | 0 refills | Status: DC

## 2020-12-15 NOTE — Discharge Instructions (Addendum)
Wound vac changes at least 3x/week. When vac is off (between changes and after discontinuation of the vac), okay to shower. No baths or soaking in water until drains are removed. Flush each JP drain with 10cc of saline or water to prevent drains from getting clogged.   No lifting greater than 5 pounds for six weeks.   Pain regimen: take over-the-counter tylenol (acetaminophen) 1000mg  every six hours, the prescription ibuprofen (600mg ) every six hours and the robaxin (methocarbamol) 750mg  every six hours. With all three of these, you should be taking something every two hours. Example: tylenol ( acetaminophen) at 8am, ibuprofen at 10am, robaxin (methocarbamol) at 12pm, tylenol (acetaminophen) again at 2pm, ibuprofen again at 4pm, robaxin (methocarbamol) at 6pm. You also have a prescription for oxycodone, which should be taken if the tylenol (acetaminophen), ibuprofen, and robaxin (methocarbamol) are not enough to control your pain. You may take the oxycodone as frequently as every four hours as needed, but if you are taking the other medications as above, you should not need the oxycodone this frequently. You have also been given a prescription for colace (docusate) which is a stool softener. Please take this as prescribed because the oxycodone can cause constipation and the colace (docusate) will minimize or prevent constipation.  Call the office at [phone] for temperature greater than 101.45F, worsening pain, redness or warmth at the incision site or drain sites.  Please call [phone] to make an appointment for 3 weeks from discharge for wound check. You will need to have a CT scan before your appointment.

## 2020-12-15 NOTE — Progress Notes (Signed)
Occupational Therapy Treatment and Discharge Patient Details Name: Jesse Waters MRN: [id number] DOB: 06/23/1995 Today's Date: 12/15/2020    History of present illness 25 y.o. 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 Abdominal closure with incisional vac changed 7/3 ostomy with Abdominal binder sepsis extubated 7/4. Re-intubated 7/5. PMH includes:  HTN, PNA   OT comments  This 25 yo male seen today to address any potential issues from an OT standpoint before possible D/C today. Pt is overall at a min guard-S level for basic ADLs and mobility from RW level and will have A prn post D/C. All education completed and no further questions concerning basic ADLs., we will sign off.  Follow Up Recommendations  Home health OT;Supervision - Intermittent    Equipment Recommendations  3 in 1 bedside commode (wide)       Precautions / Restrictions Precautions Precautions: Fall Precaution Comments: jp drain L side, wound vac to abdomen, gravity dependent drain R side Restrictions Weight Bearing Restrictions: No       Mobility Bed Mobility Overal bed mobility: Needs Assistance Bed Mobility: Supine to Sit;Sit to Supine  Supine to sit: Supervision;HOB elevated   Sit to supine: Supervision (HOB flat)       Transfers Overall transfer level: Needs assistance Equipment used: Rolling walker (2 wheeled) Transfers: Sit to/from Stand Sit to Stand: Min guard             Balance Overall balance assessment: Needs assistance Sitting-balance support: No upper extremity supported;Feet supported Sitting balance-Leahy Scale: Good Sitting balance - Comments: pt able to sustain with BLE supported, no UE support   Standing balance support: No upper extremity supported Standing balance-Leahy Scale: Fair                            ADL either performed or assessed  with clinical judgement   ADL Overall ADL's : Needs assistance/impaired                       Lower Body Dressing Details (indicate cue type and reason): Pt can turn sideways on bed to get to his feet to doff/donn socks. Neither leg is weaker than the other so he can doff pants starting with either leg. Toilet Transfer: Min guard;Ambulation;RW Toilet Transfer Details (indicate cue type and reason): walking into bathroom to stand to urinate       Tub/Shower Transfer Details (indicate cue type and reason): Pt will use 3n1 in shower stall once cleared to shower         Vision Patient Visual Report: No change from baseline            Cognition Arousal/Alertness: Awake/alert Behavior During Therapy: Flat affect Overall Cognitive Status: Within Functional Limits for tasks assessed                                 General Comments: Flat affect, conversant                   Pertinent Vitals/ Pain       Pain Assessment: Faces Faces Pain Scale: Hurts little more Pain Location: abdomen Pain Descriptors / Indicators: Guarding;Sore Pain Intervention(s): Limited activity within patient's tolerance;Monitored during session      Progress Toward Goals  OT Goals(current goals can now  be found in the care plan section)  Progress towards OT goals: Progressing toward goals  Acute Rehab OT Goals Patient Stated Goal: hopeful to go home today  Plan Discharge plan needs to be updated (all education completed)       AM-PAC OT "6 Clicks" Daily Activity     Outcome Measure   Help from another person eating meals?: None Help from another person taking care of personal grooming?: A Little Help from another person toileting, which includes using toliet, bedpan, or urinal?: A Little Help from another person bathing (including washing, rinsing, drying)?: A Little Help from another person to put on and taking off regular upper body clothing?: A Little Help from  another person to put on and taking off regular lower body clothing?: A Little 6 Click Score: 19    End of Session Equipment Utilized During Treatment: Rolling walker  OT Visit Diagnosis: Other abnormalities of gait and mobility (R26.89);Pain Pain - part of body:  (abdomen)   Activity Tolerance Patient tolerated treatment well   Patient Left in bed;with call bell/phone within reach;with family/visitor present             Time: 1610-9604 OT Time Calculation (min): 22 min  Charges: OT General Charges $OT Visit: 1 Visit OT Treatments $Self Care/Home Management : 8-22 mins  Ignacia Palma, OTR/L Acute Rehab Services Pager [phone] Office [phone]     Evette Georges 12/15/2020, 1:40 PM

## 2020-12-15 NOTE — Progress Notes (Signed)
Physical Therapy Treatment Patient Details Name: Jesse Waters MRN: [id number] DOB: May 08, 1996 Today's Date: 12/15/2020    History of Present Illness 25 y.o. 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 Abdominal closure with incisional vac changed 7/3 ostomy with Abdominal binder sepsis extubated 7/4. Re-intubated 7/5. PMH includes:  HTN, PNA    PT Comments    The pt was agreeable to session with focus on stair training and education with anticipated d/c home. The pt was more limited during today's session due to reports of increased abdominal pain (muscle spasm/cramping), but was agreeable to hallway ambulation and stair training. The pt was again limited to only 3 stairs, was educated on energy conservation, slowed activity on stairs, and decreased navigation on stairs to increase safety with stairs at home. The pt verbalized understanding of all education. Will continue to benefit from skilled PT acutely and at home to progress endurance, strength, and reduce fall risk.    Follow Up Recommendations  Home health PT;Supervision for mobility/OOB     Equipment Recommendations  3in1 (PT) (bariatric)    Recommendations for Other Services       Precautions / Restrictions Precautions Precautions: Fall Precaution Comments: jp drain L side, wound vac to abdomen, gravity dependent drain R side Restrictions Weight Bearing Restrictions: No    Mobility  Bed Mobility Overal bed mobility: Needs Assistance Bed Mobility: Rolling;Sidelying to Sit Rolling: Supervision Sidelying to sit: Min assist       General bed mobility comments: pt reaching for UE support from therapist. needing use of bed rail and light minA to elevate trunk    Transfers Overall transfer level: Needs assistance Equipment used: Rolling walker (2 wheeled) Transfers: Sit to/from Stand Sit to Stand:  Min guard         General transfer comment: Min guard to rise from edge of bed and recliner, increased time to complete all movements, VC for hand positioning  Ambulation/Gait Ambulation/Gait assistance: Supervision;Min guard Gait Distance (Feet): 75 Feet Assistive device: Rolling walker (2 wheeled) Gait Pattern/deviations: Step-through pattern;Decreased stride length;Wide base of support Gait velocity: decreased Gait velocity interpretation: <1.31 ft/sec, indicative of household ambulator General Gait Details: slow but steady pace, pt with no overt LOB, cues for posture. pt taking multiple standing rest breaks due to L sided muscle spasm   Stairs Stairs: Yes Stairs assistance: Supervision Stair Management: Two rails Number of Stairs: 3 General stair comments: pt prefers step-to pattern, heavy relaince on BUE support. despite cues to continue, pt declining further stairs despite 15 steps to enter at home. Educated on energy conservation, decreased stair management, and scheduling around pain medicines.   Wheelchair Mobility    Modified Rankin (Stroke Patients Only)       Balance Overall balance assessment: Needs assistance Sitting-balance support: Feet supported Sitting balance-Leahy Scale: Good Sitting balance - Comments: pt able to sustain with BLE supported, no UE support   Standing balance support: No upper extremity supported Standing balance-Leahy Scale: Fair Standing balance comment: pt able to demo short bouts of ambulation without UE support, no LOB. prefers BUE support for pain control                            Cognition Arousal/Alertness: Awake/alert Behavior During Therapy: Flat affect Overall Cognitive Status: Within Functional Limits for tasks assessed  General Comments: Flat affect, conversant      Exercises      General Comments General comments (skin integrity, edema, etc.): HR 110s to  130s with activity.      Pertinent Vitals/Pain Pain Assessment: Faces Faces Pain Scale: Hurts even more Pain Location: abdomen Pain Descriptors / Indicators: Cramping;Grimacing;Sharp;Spasm Pain Intervention(s): Limited activity within patient's tolerance;Monitored during session;Patient requesting pain meds-RN notified     PT Goals (current goals can now be found in the care plan section) Acute Rehab PT Goals Patient Stated Goal: go home PT Goal Formulation: With patient Time For Goal Achievement: 12/19/20 Potential to Achieve Goals: Good Progress towards PT goals: Progressing toward goals    Frequency    Min 3X/week      PT Plan Discharge plan needs to be updated       AM-PAC PT "6 Clicks" Mobility   Outcome Measure  Help needed turning from your back to your side while in a flat bed without using bedrails?: None Help needed moving from lying on your back to sitting on the side of a flat bed without using bedrails?: A Little Help needed moving to and from a bed to a chair (including a wheelchair)?: A Little Help needed standing up from a chair using your arms (e.g., wheelchair or bedside chair)?: A Little Help needed to walk in hospital room?: A Little Help needed climbing 3-5 steps with a railing? : A Little 6 Click Score: 19    End of Session Equipment Utilized During Treatment: Gait belt Activity Tolerance: Patient tolerated treatment well Patient left: with call bell/phone within reach;with family/visitor present;in chair Nurse Communication: Mobility status PT Visit Diagnosis: Muscle weakness (generalized) (M62.81);Difficulty in walking, not elsewhere classified (R26.2);Pain;Unsteadiness on feet (R26.81);Other abnormalities of gait and mobility (R26.89)     Time: 6144-3154 PT Time Calculation (min) (ACUTE ONLY): 31 min  Charges:  $Gait Training: 23-37 mins                     Jesse Waters, PT, DPT   Acute Rehabilitation Department Pager #: [phone]   Gaetana Michaelis 12/15/2020, 10:30 AM
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
## 2020-12-27 ENCOUNTER — Other Ambulatory Visit: Payer: Self-pay | Admitting: Surgery

## 2020-12-27 DIAGNOSIS — R109 Unspecified abdominal pain: Secondary | ICD-10-CM
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
